# Patient Record
Sex: Male | Born: 1975
Health system: Southern US, Community
[De-identification: ages and names within clinical notes are randomized; demographics above are authoritative.]

## PROBLEM LIST (undated history)

## (undated) DIAGNOSIS — S32009A Unspecified fracture of unspecified lumbar vertebra, initial encounter for closed fracture: Secondary | ICD-10-CM

## (undated) DIAGNOSIS — I10 Essential (primary) hypertension: Secondary | ICD-10-CM

## (undated) DIAGNOSIS — M1990 Unspecified osteoarthritis, unspecified site: Secondary | ICD-10-CM

## (undated) DIAGNOSIS — K219 Gastro-esophageal reflux disease without esophagitis: Secondary | ICD-10-CM

## (undated) DIAGNOSIS — G4733 Obstructive sleep apnea (adult) (pediatric): Secondary | ICD-10-CM

## (undated) DIAGNOSIS — T7840XA Allergy, unspecified, initial encounter: Secondary | ICD-10-CM

## (undated) HISTORY — DX: Allergy, unspecified, initial encounter: T78.40XA

## (undated) HISTORY — DX: Unspecified osteoarthritis, unspecified site: M19.90

## (undated) HISTORY — DX: Obstructive sleep apnea (adult) (pediatric): G47.33

## (undated) HISTORY — DX: Gastro-esophageal reflux disease without esophagitis: K21.9

## (undated) HISTORY — DX: Essential (primary) hypertension: I10

---

## 2009-12-22 ENCOUNTER — Emergency Department: Payer: Self-pay | Admitting: Emergency Medicine

## 2009-12-26 ENCOUNTER — Ambulatory Visit: Payer: Self-pay | Admitting: Family Medicine

## 2016-09-23 ENCOUNTER — Ambulatory Visit (INDEPENDENT_AMBULATORY_CARE_PROVIDER_SITE_OTHER): Payer: 59 | Admitting: Physician Assistant

## 2016-09-23 ENCOUNTER — Encounter: Payer: Self-pay | Admitting: Physician Assistant

## 2016-09-23 VITALS — BP 160/120 | HR 98 | Temp 98.2°F | Resp 18 | Ht 69.5 in | Wt 280.0 lb

## 2016-09-23 DIAGNOSIS — G4733 Obstructive sleep apnea (adult) (pediatric): Secondary | ICD-10-CM

## 2016-09-23 DIAGNOSIS — I10 Essential (primary) hypertension: Secondary | ICD-10-CM | POA: Diagnosis not present

## 2016-09-23 DIAGNOSIS — Z0289 Encounter for other administrative examinations: Secondary | ICD-10-CM

## 2016-09-23 DIAGNOSIS — Z7689 Persons encountering health services in other specified circumstances: Secondary | ICD-10-CM | POA: Diagnosis not present

## 2016-09-23 MED ORDER — HYDROCHLOROTHIAZIDE 25 MG PO TABS: 25.0000 mg | ORAL_TABLET | Freq: Every day | ORAL | 3 refills | Status: DC

## 2016-09-23 NOTE — Progress Notes (Signed)
Patient ID: Adam Crosby MRN: [id number], DOB: January 22, 1976, 40 y.o. Date of Encounter: '@DATE'$ @  Chief Complaint:  Chief Complaint  Patient presents with  . New Patient (Initial Visit)    PHQ SCORE 1    HPI: 40 y.o. year old AA male  presents as above.   He is here as a new patient to establish care. His wife accompanies him for his visit today.  He reports that he was on blood pressure medication in the past.  However that specific blood pressure medication seemed to actually make his blood pressure get even higher and seem to cause nosebleeds (patient is convinced of this).  However says that when he followed up with his doctor, instead they "increased the medicine and made the symptoms even worse."  Patient states that the medication was amlodipine 10 mg. Because of these "adverse effects ", he stopped the blood pressure medication.  Also he states that he needs me to fill out this FMLA form for him He brought this in with him today). He says that in the past he was at work and complained of chest pain and his blood pressure was high and they told him that he could not continue to work-- that he had to leave and go to the doctor. Says that he used up his vacation time for those sick visits. Says that he needs FMLA paperwork completed so he can use FMLA for things like today's visit and any follow-up office visits.  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  Wife also reports that patient stops breathing in his sleep.  They have no other complaints or concerns that they wanted to address today.    Past Medical History:  Diagnosis Date  . Allergy   . Arthritis   . Hypertension      Home Meds: No outpatient prescriptions prior to visit.   No facility-administered medications prior to visit.     Allergies: No Known Allergies  Social History    Social History  . Marital status: Single    Spouse name: N/A  . Number of children: N/A  . Years of education: N/A   Occupational History  . Not on file.   Social History Main Topics  . Smoking status: Former Research scientist (life sciences)  . Smokeless tobacco: Never Used  . Alcohol use 0.6 oz/week    1 Cans of beer per week     Comment: EVERYOTHER MONTH  . Drug use: No  . Sexual activity: Yes   Other Topics Concern  . Not on file   Social History Narrative  . No narrative on file    Family History  Problem Relation Age of Onset  . Arthritis Mother   . Hyperlipidemia Mother   . Hypertension Mother   . Varicose Veins Mother   . Diabetes Daughter   . Arthritis Maternal Grandmother   . Cancer Maternal Grandmother   . Diabetes Maternal Grandmother   . Varicose Veins Maternal Grandmother   . Arthritis Maternal Grandfather   . Cancer Maternal Grandfather   . Hearing loss Maternal Grandfather      Review of Systems:  See HPI for pertinent ROS. All other ROS negative.    Physical Exam: Blood pressure (!) 160/120, pulse 98, temperature 98.2 F (36.8 C), temperature source Oral, resp. rate 18, height 5' 9.5" (1.765 m), weight 280 lb (  127 kg), SpO2 96 %., Body mass index is 40.76 kg/m. General: Obese AAM. Appears in no acute distress. Neck: Supple. No thyromegaly. No lymphadenopathy. He has large neck.  Lungs: Clear bilaterally to auscultation without wheezes, rales, or rhonchi. Breathing is unlabored. Heart: RRR with S1 S2. No murmurs, rubs, or gallops. Abdomen: Soft, non-tender, non-distended with normoactive bowel sounds. No hepatomegaly. No rebound/guarding. No obvious abdominal masses. Musculoskeletal:  Strength and tone normal for age. Extremities/Skin: Warm and dry. No LE edema.  Neuro: Alert and oriented X 3. Moves all extremities spontaneously. Gait is normal. CNII-XII grossly in tact. Psych:  Responds to questions appropriately with a normal affect.     ASSESSMENT AND PLAN:   40 y.o. year old male with  1. Encounter to establish care  2. Essential hypertension Blood pressure is elevated Do not use amlodipine as patient is convinced that this caused adverse effects for him At this time will use HCTZ 25 mg daily. He is to have follow-up office visit in 2 weeks to recheck blood pressure and be met on medication. - hydrochlorothiazide (HYDRODIURIL) 25 MG tablet; Take 1 tablet (25 mg total) by mouth daily.  Dispense: 30 tablet; Refill:0  3. OSA (obstructive sleep apnea) He does have hypertension obesity large neck size and wife reports that she hears times of apnea we'll obtain sleep study. - Split night study; Future  4. Encounter for completion of form with patient I have completed FMLA form. I have documented that he has a follow-up appointment scheduled for 10/09/16. Have documented that after that visit he will need 1 more visit approximate 2 weeks later. I have documented then then after that appointment he will need to be seen every 6 months for office visit and lab work. Documented that at those times he will need to be out of work for 2 hours.  I discussed with patient and wife today that I would recommend him coming in for complete physical exam to check cholesterol etc. And update preventive care-- once we get these other issues addressed. They are interested in doing this and agreeable. At his next visit, once BP is closer to being at goal, I will then have him schedule a complete physical exam and come fasting for that appointment.  [address] [place], [place], [facility] 09/23/2016 2:55 PM

## 2016-09-24 ENCOUNTER — Other Ambulatory Visit: Payer: Self-pay | Admitting: Family Medicine

## 2016-09-24 DIAGNOSIS — G4730 Sleep apnea, unspecified: Secondary | ICD-10-CM

## 2016-10-09 ENCOUNTER — Encounter: Payer: Self-pay | Admitting: Physician Assistant

## 2016-10-09 ENCOUNTER — Ambulatory Visit (INDEPENDENT_AMBULATORY_CARE_PROVIDER_SITE_OTHER): Payer: 59 | Admitting: Physician Assistant

## 2016-10-09 VITALS — BP 158/102 | HR 106 | Temp 99.1°F | Resp 16 | Wt 276.0 lb

## 2016-10-09 DIAGNOSIS — K219 Gastro-esophageal reflux disease without esophagitis: Secondary | ICD-10-CM | POA: Insufficient documentation

## 2016-10-09 DIAGNOSIS — I10 Essential (primary) hypertension: Secondary | ICD-10-CM

## 2016-10-09 HISTORY — DX: Gastro-esophageal reflux disease without esophagitis: K21.9

## 2016-10-09 LAB — BASIC METABOLIC PANEL WITH GFR
BUN: 13 mg/dL (ref 7–25)
CO2: 33 mmol/L — ABNORMAL HIGH (ref 20–31)
Calcium: 9.3 mg/dL (ref 8.6–10.3)
Chloride: 94 mmol/L — ABNORMAL LOW (ref 98–110)
Creat: 1.18 mg/dL (ref 0.60–1.35)
GFR, Est African American: >89 mL/min (ref >=60)
GFR, Est Non African American: 78 mL/min (ref >=60)
Glucose, Bld: 97 mg/dL (ref 70–99)
Potassium: 3.1 mmol/L — ABNORMAL LOW (ref 3.5–5.3)
Sodium: 141 mmol/L (ref 135–146)

## 2016-10-09 MED ORDER — LOSARTAN POTASSIUM 50 MG PO TABS: 50.0000 mg | ORAL_TABLET | Freq: Every day | ORAL | 0 refills | Status: DC

## 2016-10-09 MED ORDER — OMEPRAZOLE 20 MG PO CPDR: 20.0000 mg | DELAYED_RELEASE_CAPSULE | Freq: Every day | ORAL | 3 refills | Status: DC

## 2016-10-09 NOTE — Progress Notes (Signed)
Patient ID: Adam Crosby MRN: [id number], DOB: 01/18/1976, 40 y.o. Date of Encounter: '@DATE'$ @  Chief Complaint:  Chief Complaint  Patient presents with  . Chest Pain  . Diarrhea    HPI: 40 y.o. year old AA male  presents as above.   09/23/2016: He is here as a new patient to establish care. His wife accompanies him for his visit today.  He reports that he was on blood pressure medication in the past.  However that specific blood pressure medication seemed to actually make his blood pressure get even higher and seem to cause nosebleeds (patient is convinced of this).  However says that when he followed up with his doctor, instead they "increased the medicine and made the symptoms even worse."  Patient states that the medication was amlodipine 10 mg. Because of these "adverse effects ", he stopped the blood pressure medication.  Also he states that he needs me to fill out this FMLA form for him He brought this in with him today). He says that in the past he was at work and complained of chest pain and his blood pressure was high and they told him that he could not continue to work-- that he had to leave and go to the doctor. Says that he used up his vacation time for those sick visits. Says that he needs FMLA paperwork completed so he can use FMLA for things like today's visit and any follow-up office visits.  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  Wife also reports that patient stops breathing in his sleep.  They have no other complaints or concerns that they wanted to address today.  AT THAT OV: Rxed HCTZ '25mg'$  QD Ordered Sleep Study  10/09/2016; States that he is taking the HCTZ 25 mg daily. As he actually has been feeling much better since he started this. "Not feeling sluggish "says that some of the other men at his work also have high blood  pressure so they have started walking together after work. He also was drinking sodas at work but has changed to water. That he has lost several pounds since his last visit here. Dates that they have called his wife about the sleep study and he is going to have to call them back to get this scheduled. Says that he has had diarrhea all week. Continued to go to work until yesterday -- yesterday his wife told him he just needed to stay home. Says that he needs a note to turn in to his supervisor. States that he frequently has discomfort in his chest. Says that he it occurs more so at night when he is in bed. He has no chest pressure heaviness or tightness with exertion. He has been walking with coworkers for exercise and is not having angina symptoms with this.  Past Medical History:  Diagnosis Date  . Allergy   . Arthritis   . GERD (gastroesophageal reflux disease) 10/09/2016  . Hypertension      Home Meds: Outpatient Medications Prior to Visit  Medication Sig Dispense Refill  . hydrochlorothiazide (HYDRODIURIL) 25 MG tablet Take 1 tablet (25 mg total) by mouth daily. 90 tablet 3   No facility-administered medications prior to visit.     Allergies: No Known Allergies  Social History   Social History  . Marital status: Single    Spouse name:  N/A  . Number of children: N/A  . Years of education: N/A   Occupational History  . Not on file.   Social History Main Topics  . Smoking status: Former Research scientist (life sciences)  . Smokeless tobacco: Never Used  . Alcohol use 0.6 oz/week    1 Cans of beer per week     Comment: EVERYOTHER MONTH  . Drug use: No  . Sexual activity: Yes   Other Topics Concern  . Not on file   Social History Narrative  . No narrative on file    Family History  Problem Relation Age of Onset  . Arthritis Mother   . Hyperlipidemia Mother   . Hypertension Mother   . Varicose Veins Mother   . Diabetes Daughter   . Arthritis Maternal Grandmother   . Cancer Maternal  Grandmother   . Diabetes Maternal Grandmother   . Varicose Veins Maternal Grandmother   . Arthritis Maternal Grandfather   . Cancer Maternal Grandfather   . Hearing loss Maternal Grandfather      Review of Systems:  See HPI for pertinent ROS. All other ROS negative.    Physical Exam: Blood pressure (!) 158/102, pulse (!) 106, temperature 99.1 F (37.3 C), temperature source Oral, resp. rate 16, weight 276 lb (125.2 kg), SpO2 97 %., Body mass index is 40.17 kg/m. General: Obese AAM. Appears in no acute distress. Neck: Supple. No thyromegaly. No lymphadenopathy. He has large neck.  Lungs: Clear bilaterally to auscultation without wheezes, rales, or rhonchi. Breathing is unlabored. Heart: RRR with S1 S2. No murmurs, rubs, or gallops. Abdomen: Soft, non-tender, non-distended with normoactive bowel sounds. No hepatomegaly. No rebound/guarding. No obvious abdominal masses. Musculoskeletal:  Strength and tone normal for age. No tenderness with palpation of the chest wall and along the peri-sternal region. Extremities/Skin: Warm and dry. No LE edema.  Neuro: Alert and oriented X 3. Moves all extremities spontaneously. Gait is normal. CNII-XII grossly in tact. Psych:  Responds to questions appropriately with a normal affect.     ASSESSMENT AND PLAN:  40 y.o. year old male with    Essential hypertension Blood pressure is improved but still suboptimal.   Continue HCTZ 25 mg daily. Add losartan 50 mg daily. Follow-up in 2 weeks for OV and follow-up lab.  Gastroesophageal reflux disease, esophagitis presence not specified Suspect that his chest discomfort is coming from acid reflux. Symptoms are worse when lying in bed at night. There is no tenderness with palpation of the chest wall or the peri- sternal region. Symptoms are not exertional and not consistent with angina. - omeprazole (PRILOSEC) 20 MG capsule; Take 1 capsule (20 mg total) by mouth daily.  Dispense: 30 capsule; Refill:  3  10/09/2016 OV--Note given to cover out of work yesterday with diarrhea.   OSA (obstructive sleep apnea) He does have hypertension obesity large neck size and wife reports that she hears times of apnea we'll obtain sleep study. Sleep Study was ordered at his visit with me 09/23/16. He is in the process of getting this scheduled.  At initial [place] 09/2016--Did this---Encounter for completion of form with patient I have completed FMLA form. I have documented that he has a follow-up appointment scheduled for 10/09/16. Have documented that after that visit he will need 1 more visit approximate 2 weeks later. I have documented then then after that appointment he will need to be seen every 6 months for office visit and lab work. Documented that at those times he will need to be out  of work for 2 hours.   He will return for follow-up in 2 weeks. Will schedule that visit as a complete physical exam for early mornings he can come fasting to that appointment to check fasting labs. Also follow-up his blood pressure and be met and also see whether adding omeprazole has helped with his chest discomfort.  Marin Olp [place], [place], [facility] 10/09/2016 8:42 AM

## 2016-10-22 ENCOUNTER — Encounter: Payer: Self-pay | Admitting: Physician Assistant

## 2016-10-22 ENCOUNTER — Ambulatory Visit (INDEPENDENT_AMBULATORY_CARE_PROVIDER_SITE_OTHER): Payer: 59 | Admitting: Physician Assistant

## 2016-10-22 VITALS — BP 120/82 | HR 99 | Temp 98.3°F | Resp 18 | Wt 276.0 lb

## 2016-10-22 DIAGNOSIS — K219 Gastro-esophageal reflux disease without esophagitis: Secondary | ICD-10-CM | POA: Diagnosis not present

## 2016-10-22 DIAGNOSIS — I10 Essential (primary) hypertension: Secondary | ICD-10-CM | POA: Diagnosis not present

## 2016-10-22 DIAGNOSIS — G4733 Obstructive sleep apnea (adult) (pediatric): Secondary | ICD-10-CM

## 2016-10-22 DIAGNOSIS — Z0000 Encounter for general adult medical examination without abnormal findings: Secondary | ICD-10-CM

## 2016-10-22 HISTORY — DX: Obstructive sleep apnea (adult) (pediatric): G47.33

## 2016-10-22 NOTE — Progress Notes (Signed)
Patient ID: Adam Crosby MRN: [id number], DOB: Apr 25, 1976 40 y.o. Date of Encounter: 10/22/2016, 3:14 PM    Chief Complaint: Physical (CPE)  HPI: 40 y.o. y/o male here for CPE.   He has had 2 office visits with me prior to today. At his visit with me 09/23/16 he had significant hypertension. At that visit HCTZ 25 mg daily was added. He had follow-up office visit with me 10/09/16. Blood pressure was improved but still not at goal. Losartan 50 mg daily was added at that time. Today he states that he is still taking both of these medicines as directed. No adverse effects.  Also reviewed that it last office visit 10/09/16 added omeprazole for GERD symptoms. Today he states that this is working well and that he feels a lot better.  At initial visit 09/23/16 I had also placed order for sleep study. He states that they were still in the process of calling each other back and forth to actually get this scheduled.  Obesity: At visit 10/09/16 he stated that he and some friends from work had started walking together after work. He had stopped sodas and was drinking water.  Today he says that he has slacked off on this some. We also reviewed his weights. 09/23/16--- 280. 10/09/16--- 276.   Today--- 276  He has no specific complaints or concerns to address today.   Review of Systems: Consitutional: No fever, chills, fatigue, night sweats, lymphadenopathy, or weight changes. Eyes: No visual changes, eye redness, or discharge. ENT/Mouth: Ears: No otalgia, tinnitus, hearing loss, discharge. Nose: No congestion, rhinorrhea, sinus pain, or epistaxis. Throat: No sore throat, post nasal drip, or teeth pain. Cardiovascular: No CP, palpitations, diaphoresis, DOE, edema, orthopnea, PND. Respiratory: No cough, hemoptysis, SOB, or wheezing. Gastrointestinal: No anorexia, dysphagia, reflux, pain, nausea, vomiting, hematemesis, diarrhea, constipation, BRBPR, or melena. Genitourinary: No dysuria, frequency,  urgency, hematuria, incontinence, nocturia, decreased urinary stream, discharge, impotence, or testicular pain/masses. Musculoskeletal: No decreased ROM, myalgias, stiffness, joint swelling, or weakness. Skin: No rash, erythema, lesion changes, pain, warmth, jaundice, or pruritis. Neurological: No headache, dizziness, syncope, seizures, tremors, memory loss, coordination problems, or paresthesias. Psychological: No anxiety, depression, hallucinations, SI/HI. Endocrine: No fatigue, polydipsia, polyphagia, polyuria, or known diabetes. All other systems were reviewed and are otherwise negative.  Past Medical History:  Diagnosis Date  . Allergy   . Arthritis   . GERD (gastroesophageal reflux disease) 10/09/2016  . Hypertension      No past surgical history on file.  Home Meds:  Outpatient Medications Prior to Visit  Medication Sig Dispense Refill  . hydrochlorothiazide (HYDRODIURIL) 25 MG tablet Take 1 tablet (25 mg total) by mouth daily. 90 tablet 3  . losartan (COZAAR) 50 MG tablet Take 1 tablet (50 mg total) by mouth daily. 30 tablet 0  . omeprazole (PRILOSEC) 20 MG capsule Take 1 capsule (20 mg total) by mouth daily. 30 capsule 3   No facility-administered medications prior to visit.     Allergies: No Known Allergies  Social History   Social History  . Marital status: Single    Spouse name: N/A  . Number of children: N/A  . Years of education: N/A   Occupational History  . Not on file.   Social History Main Topics  . Smoking status: Former Games developer  . Smokeless tobacco: Never Used  . Alcohol use 0.6 oz/week    1 Cans of beer per week     Comment: EVERYOTHER MONTH  . Drug use: No  .  Sexual activity: Yes   Other Topics Concern  . Not on file   Social History Narrative  . No narrative on file    Family History  Problem Relation Age of Onset  . Arthritis Mother   . Hyperlipidemia Mother   . Hypertension Mother   . Varicose Veins Mother   . Diabetes Daughter     . Arthritis Maternal Grandmother   . Cancer Maternal Grandmother   . Diabetes Maternal Grandmother   . Varicose Veins Maternal Grandmother   . Arthritis Maternal Grandfather   . Cancer Maternal Grandfather   . Hearing loss Maternal Grandfather     Physical Exam: Blood pressure 120/82, pulse 99, temperature 98.3 F (36.8 C), temperature source Oral, resp. rate 18, weight 276 lb (125.2 kg), SpO2 96 %.  General: Obese AAM. Appears in no acute distress. HEENT: Normocephalic, atraumatic. Conjunctiva pink, sclera non-icteric. Pupils 2 mm constricting to 1 mm, round, regular, and equally reactive to light and accomodation. EOMI. Internal auditory canal clear. TMs with good cone of light and without pathology. Nasal mucosa pink. Nares are without discharge. No sinus tenderness. Oral mucosa pink. Pharynx without exudate.   Neck: Supple. Trachea midline. No thyromegaly. Full ROM. No lymphadenopathy. Lungs: Clear to auscultation bilaterally without wheezes, rales, or rhonchi. Breathing is of normal effort and unlabored. Cardiovascular: RRR with S1 S2. No murmurs, rubs, or gallops. Distal pulses 2+ symmetrically. No carotid or abdominal bruits. Abdomen: Soft, non-tender, non-distended with normoactive bowel sounds. No hepatosplenomegaly or masses. No rebound/guarding. No CVA tenderness. No hernias. Musculoskeletal: Full range of motion and 5/5 strength throughout.  Skin: Warm and moist without erythema, ecchymosis, wounds, or rash. Neuro: A+Ox3. CN II-XII grossly intact. Moves all extremities spontaneously. Full sensation throughout. Normal gait. DTR 2+ throughout upper and lower extremities.  Psych:  Responds to questions appropriately with a normal affect.   Assessment/Plan:  40 y.o. y/o  male here for CPE  -1. Encounter for preventive health examination  A. Screening Labs: He is not fasting today but states that he can return fasting tomorrow morning. Will check screening labs at that time. -  CBC with Differential/Platelet; Future - COMPLETE METABOLIC PANEL WITH GFR; Future - Lipid panel; Future - TSH; Future - VITAMIN D 25 Hydroxy (Vit-D Deficiency, Fractures); Future   B. Screening For Prostate Cancer: No indication to start this until age 40  C. Screening For Colorectal Cancer:  No indication to start this until age 40. The only family history of colon cancer he has is with his maternal grandfather but he did not develop colon cancer until age 40.  D. Immunizations: Flu----------- recommended flu vaccine but he defers. Tetanus-----he has a card in his wallet documenting tetanus vaccine and this was given 12/22/2009. Pneumococcal--- has no indication to require a pneumonia vaccine until age 40 Zostavax------------ not indicated to give until age 40    2. Essential hypertension Blood Pressure is now at goal. He is going to return fasting for labs tomorrow and will recheck bmet at that time.  3. Gastroesophageal reflux disease, esophagitis presence not specified Symptoms are now controlled with omeprazole.  4. Severe obesity (BMI >= 40) (HCC) At visit 10/09/16 he stated that he and some friends from work had started walking together after work. He had stopped sodas and was drinking water.  Today he says that he has slacked off on this some. We also reviewed his weights. 09/23/16--- 280. 10/09/16--- 276.   Today--- 276 Encouraged him to be careful of his diet  intake during the holidays and then after the holidays needs to get back with the walking and the stricter diet changes.  5. OSA (obstructive sleep apnea) At visit 09/23/16 sleep study was ordered. He is still in the process of getting this scheduled that does plan to follow-up with doing this.  If things remain stable can wait 6 months for routine follow-up visit. Follow-up sooner if needed.  Signed:   [address] Beth [place], [place]  10/22/2016 3:14 PM

## 2016-10-23 ENCOUNTER — Other Ambulatory Visit: Payer: 59

## 2016-10-23 DIAGNOSIS — Z0000 Encounter for general adult medical examination without abnormal findings: Secondary | ICD-10-CM

## 2016-10-23 LAB — COMPLETE METABOLIC PANEL WITH GFR
ALT: 38 U/L (ref 9–46)
AST: 24 U/L (ref 10–40)
Albumin: 3.7 g/dL (ref 3.6–5.1)
Alkaline Phosphatase: 57 U/L (ref 40–115)
BUN: 13 mg/dL (ref 7–25)
CO2: 34 mmol/L — ABNORMAL HIGH (ref 20–31)
Calcium: 8.7 mg/dL (ref 8.6–10.3)
Chloride: 99 mmol/L (ref 98–110)
Creat: 1.01 mg/dL (ref 0.60–1.35)
GFR, Est African American: >89 mL/min (ref >=60)
GFR, Est Non African American: >89 mL/min (ref >=60)
Glucose, Bld: 101 mg/dL — ABNORMAL HIGH (ref 70–99)
Potassium: 3.5 mmol/L (ref 3.5–5.3)
Sodium: 143 mmol/L (ref 135–146)
Total Bilirubin: 0.5 mg/dL (ref 0.2–1.2)
Total Protein: 7 g/dL (ref 6.1–8.1)

## 2016-10-23 LAB — CBC WITH DIFFERENTIAL/PLATELET
Basophils Absolute: 0 cells/uL (ref 0–200)
Basophils Relative: 0 %
Eosinophils Absolute: 177 cells/uL (ref 15–500)
Eosinophils Relative: 3 %
HCT: 46.9 % (ref 38.5–50.0)
Hemoglobin: 15.1 g/dL (ref 13.0–17.0)
Lymphocytes Relative: 32 %
Lymphs Abs: 1888 cells/uL (ref 850–3900)
MCH: 27 pg (ref 27.0–33.0)
MCHC: 32.2 g/dL (ref 32.0–36.0)
MCV: 83.8 fL (ref 80.0–100.0)
MPV: 9.6 fL (ref 7.5–12.5)
Monocytes Absolute: 295 cells/uL (ref 200–950)
Monocytes Relative: 5 %
Neutro Abs: 3540 cells/uL (ref 1500–7800)
Neutrophils Relative %: 60 %
Platelets: 307 10*3/uL (ref 140–400)
RBC: 5.6 MIL/uL (ref 4.20–5.80)
RDW: 14.7 % (ref 11.0–15.0)
WBC: 5.9 10*3/uL (ref 3.8–10.8)

## 2016-10-23 LAB — LIPID PANEL
Cholesterol: 165 mg/dL (ref <200)
HDL: 37 mg/dL — ABNORMAL LOW (ref >40)
LDL Cholesterol: 103 mg/dL — ABNORMAL HIGH (ref <100)
Total CHOL/HDL Ratio: 4.5 Ratio (ref <5.0)
Triglycerides: 123 mg/dL (ref <150)
VLDL: 25 mg/dL (ref <30)

## 2016-10-23 LAB — TSH: TSH: 1.21 mIU/L (ref 0.40–4.50)

## 2016-10-24 LAB — VITAMIN D 25 HYDROXY (VIT D DEFICIENCY, FRACTURES): Vit D, 25-Hydroxy: 11 ng/mL — ABNORMAL LOW (ref 30–100)

## 2016-10-26 ENCOUNTER — Telehealth: Payer: Self-pay

## 2016-10-26 DIAGNOSIS — E559 Vitamin D deficiency, unspecified: Secondary | ICD-10-CM | POA: Insufficient documentation

## 2016-10-26 MED ORDER — CHOLECALCIFEROL 100 MCG (4000 UT) PO CAPS: 1.0000 | ORAL_CAPSULE | Freq: Every day | ORAL | Status: DC

## 2016-10-26 NOTE — Telephone Encounter (Signed)
Vit D added to med list

## 2016-10-27 ENCOUNTER — Other Ambulatory Visit: Payer: Self-pay | Admitting: Physician Assistant

## 2016-10-27 DIAGNOSIS — I10 Essential (primary) hypertension: Secondary | ICD-10-CM

## 2016-12-12 ENCOUNTER — Other Ambulatory Visit: Payer: Self-pay | Admitting: Physician Assistant

## 2016-12-12 DIAGNOSIS — I10 Essential (primary) hypertension: Secondary | ICD-10-CM

## 2016-12-14 NOTE — Telephone Encounter (Signed)
Refill appropriate 

## 2016-12-16 ENCOUNTER — Ambulatory Visit: Payer: 59 | Admitting: Physician Assistant

## 2016-12-21 ENCOUNTER — Encounter: Payer: Self-pay | Admitting: Physician Assistant

## 2016-12-21 ENCOUNTER — Ambulatory Visit (INDEPENDENT_AMBULATORY_CARE_PROVIDER_SITE_OTHER): Payer: 59 | Admitting: Physician Assistant

## 2016-12-21 VITALS — BP 122/88 | HR 117 | Temp 99.6°F | Resp 18 | Wt 280.0 lb

## 2016-12-21 DIAGNOSIS — A084 Viral intestinal infection, unspecified: Secondary | ICD-10-CM

## 2016-12-21 MED ORDER — PROMETHAZINE HCL 25 MG PO TABS: 25.0000 mg | ORAL_TABLET | Freq: Three times a day (TID) | ORAL | 0 refills | Status: DC | PRN

## 2016-12-21 NOTE — Progress Notes (Signed)
Patient ID: Adam KirksShuan Crosby MRN: [id number], DOB: [birth year], 41 y.o. Date of Encounter: 12/21/2016, 10:00 AM    Chief Complaint:  Chief Complaint  Patient presents with  . Diarrhea    x5days  . Fever  . Emesis     HPI: 41 y.o. year old male presents with above.   Says that this started Wednesday, February 7. That day he had some low-grade fever and diarrhea. Thuirsday and Friday he did go to work. He had no diarrhea but says he really wasn't eating as much as usual. Did feel bad those days but he "just pushed through". Then on Saturday "pushed myself ". Says that Sunday he ended up having to lay down and then about 5 PM started with vomiting and diarrhea. Vomited 3 or 4 times and has had some diarrhea. Did eat toast and eggs Sunday morning. Says that when he has eaten he has been eating usual foods and his usual diet.  Says his wife Haitigrandbaby and daughter are all sick with GI symptoms.     Home Meds:   Outpatient Medications Prior to Visit  Medication Sig Dispense Refill  . Cholecalciferol 4000 units CAPS Take 1 capsule (4,000 Units total) by mouth daily. 30 capsule   . hydrochlorothiazide (HYDRODIURIL) 25 MG tablet Take 1 tablet (25 mg total) by mouth daily. 90 tablet 3  . losartan (COZAAR) 50 MG tablet TAKE ONE TABLET BY MOUTH ONCE DAILY 90 tablet 1  . omeprazole (PRILOSEC) 20 MG capsule Take 1 capsule (20 mg total) by mouth daily. 30 capsule 3   No facility-administered medications prior to visit.     Allergies: No Known Allergies    Review of Systems: See HPI for pertinent ROS. All other ROS negative.    Physical Exam: Blood pressure 122/88, pulse 88, temperature 99.6 F (37.6 C), temperature source Oral, resp. rate 18, weight 280 lb (127 kg), SpO2 97 %., Body mass index is 40.76 kg/m. General: Obese AAM.  Appears in no acute distress. Neck: Supple. No thyromegaly. No lymphadenopathy. Lungs: Clear bilaterally to auscultation without wheezes, rales, or rhonchi.  Breathing is unlabored. Heart: Regular rhythm. No murmurs, rubs, or gallops. Abdomen: Soft,  non-distended with normoactive bowel sounds. No hepatomegaly. No rebound/guarding. No obvious abdominal masses.No focal/localized area of tenderness with palpation. No area of tenderness with palpation--says "it's been hurting all across there" Msk:  Strength and tone normal for age. Extremities/Skin: Warm and dry.  Neuro: Alert and oriented X 3. Moves all extremities spontaneously. Gait is normal. CNII-XII grossly in tact. Psych:  Responds to questions appropriately with a normal affect.     ASSESSMENT AND PLAN:  41 y.o. year old male with  1. Viral gastroenteritis - promethazine (PHENERGAN) 25 MG tablet; Take 1 tablet (25 mg total) by mouth every 8 (eight) hours as needed for nausea or vomiting.  Dispense: 20 tablet; Refill: 0 Cautioned that Phenergan will cause drowsiness. He can use this every 4-6 hours as needed for nausea or vomiting. Stay with clear liquid diet and then gradually advance to a bland diet. Discussed that even if his nausea decreases enough that he feels like he can eat, that he needs to stay with this diet to give his GI system a rest from having to digest food. Note given to cover him missing work last Wednesday and also for him to be out today and tomorrow. F/U if symptoms worsen or are not resolving in the next 48 hours.  Signed, Shon HaleMary Beth [place], [place], [place]  12/21/2016 10:00 AM

## 2016-12-23 ENCOUNTER — Telehealth: Payer: Self-pay | Admitting: Physician Assistant

## 2016-12-23 NOTE — Telephone Encounter (Signed)
Pt is still having severe diarrhea and wants to know if the doctor note can be extended. Please call back. If so please fax doctor note to [phone].

## 2016-12-23 NOTE — Telephone Encounter (Signed)
Tried calling pt no answer note faxed to [phone]

## 2016-12-23 NOTE — Telephone Encounter (Signed)
Pls see note below and advise  

## 2016-12-23 NOTE — Telephone Encounter (Signed)
Had OV 12/21/16. At that time I gave note including out of work 2/12 and 2/13. At this time can give another note for him to be out of work 2/14 , 2/15, and 2/16 with plans to return Monday 12/28/16. Tell him to force fluids----even if he doesn't feel real thirsty make himself drink ginger ale etc. In order to prevent getting dehydrated from fluid loss from diarrhea. If not resolving by midday Friday, call here Friday.

## 2017-01-07 ENCOUNTER — Encounter: Payer: Self-pay | Admitting: Physician Assistant

## 2017-02-25 ENCOUNTER — Other Ambulatory Visit: Payer: Self-pay | Admitting: Physician Assistant

## 2017-02-25 DIAGNOSIS — K219 Gastro-esophageal reflux disease without esophagitis: Secondary | ICD-10-CM

## 2017-03-09 ENCOUNTER — Encounter: Payer: Self-pay | Admitting: Physician Assistant

## 2017-03-23 ENCOUNTER — Encounter: Payer: Self-pay | Admitting: Family Medicine

## 2017-03-23 ENCOUNTER — Ambulatory Visit (INDEPENDENT_AMBULATORY_CARE_PROVIDER_SITE_OTHER): Payer: 59 | Admitting: Family Medicine

## 2017-03-23 ENCOUNTER — Ambulatory Visit (HOSPITAL_COMMUNITY)
Admission: RE | Admit: 2017-03-23 | Discharge: 2017-03-23 | Disposition: A | Discharge status: Home / Self Care | Payer: 59 | Source: Ambulatory Visit | Attending: Family Medicine | Admitting: Family Medicine

## 2017-03-23 VITALS — BP 146/110 | HR 100 | Temp 99.0°F | Resp 22 | Ht 68.5 in | Wt 276.0 lb

## 2017-03-23 DIAGNOSIS — G4733 Obstructive sleep apnea (adult) (pediatric): Secondary | ICD-10-CM

## 2017-03-23 DIAGNOSIS — R079 Chest pain, unspecified: Secondary | ICD-10-CM | POA: Diagnosis present

## 2017-03-23 DIAGNOSIS — I10 Essential (primary) hypertension: Secondary | ICD-10-CM

## 2017-03-23 MED ORDER — METOPROLOL SUCCINATE ER 25 MG PO TB24: 25.0000 mg | ORAL_TABLET | Freq: Every day | ORAL | 3 refills | Status: DC

## 2017-03-23 NOTE — Progress Notes (Signed)
Subjective:    Patient ID: Adam Crosby, male    DOB: 31-Jan-1976, 41 y.o.   MRN: [id number]  HPI Patient is a 41 year old African-American male with a history of morbid obesity, obstructive sleep apnea, hypertension, and mild dyslipidemia who presents today with left-sided chest pain.  He states that the pain has been occurring off and on for the last 2-3 months. The pain occurs in his chest with movement. If he twists, bends over to pick something up, works rapidly at work, he will feel a pressure stabbing soreness in his chest. However he denies any dyspnea on exertion and he denies any chest pain with exertion. As long as he is not using his arms or his upper torso, he can walk 2-3 miles with no chest discomfort and does every evening. He denies any angina. He denies any shortness of breath. He is not wearing CPAP because it has not been ordered yet. He is also under tremendous amount of stress. His daughter was recently arrested for stabbing her partner. His grandson is causing problems in school that he is having to raise for his daughter. His grandmother who is basically his parent is dying from breast cancer. All this is occurring simultaneously in causing tremendous stress.  Past Medical History:  Diagnosis Date  . Allergy   . Arthritis   . GERD (gastroesophageal reflux disease) 10/09/2016  . Hypertension   . OSA (obstructive sleep apnea) 10/22/2016   No past surgical history on file. Current Outpatient Prescriptions on File Prior to Visit  Medication Sig Dispense Refill  . Cholecalciferol 4000 units CAPS Take 1 capsule (4,000 Units total) by mouth daily. 30 capsule   . hydrochlorothiazide (HYDRODIURIL) 25 MG tablet Take 1 tablet (25 mg total) by mouth daily. 90 tablet 3  . losartan (COZAAR) 50 MG tablet TAKE ONE TABLET BY MOUTH ONCE DAILY 90 tablet 1  . omeprazole (PRILOSEC) 20 MG capsule TAKE ONE CAPSULE BY MOUTH ONCE DAILY 30 capsule 3   No current facility-administered  medications on file prior to visit.    No Known Allergies Social History   Social History  . Marital status: Single    Spouse name: N/A  . Number of children: N/A  . Years of education: N/A   Occupational History  . Not on file.   Social History Main Topics  . Smoking status: Former Games developermoker  . Smokeless tobacco: Never Used  . Alcohol use 0.6 oz/week    1 Cans of beer per week     Comment: EVERYOTHER MONTH  . Drug use: No  . Sexual activity: Yes   Other Topics Concern  . Not on file   Social History Narrative  . No narrative on file      Review of Systems  All other systems reviewed and are negative.      Objective:   Physical Exam  Constitutional: He appears well-developed and well-nourished.  Neck: No JVD present.  Cardiovascular: Normal rate, regular rhythm and normal heart sounds.   No murmur heard. Pulmonary/Chest: Effort normal and breath sounds normal. No respiratory distress. He has no wheezes. He has no rales. He exhibits no tenderness.  Abdominal: Soft. Bowel sounds are normal. He exhibits no distension. There is no tenderness. There is no rebound and no guarding.  Musculoskeletal: He exhibits no edema.  Vitals reviewed.  EKG reveals normal sinus rhythm with normal intervals and normal axis. There are nonspecific ST changes in lead 1, and lead aVL. There is no evidence of  infarction.             Assessment & Plan:  Chest pain, unspecified type - Plan: EKG 12-Lead, DG Chest 2 View  Essential hypertension - Plan: metoprolol succinate (TOPROL-XL) 25 MG 24 hr tablet  Severe obesity (BMI >= 40) (HCC)  OSA (obstructive sleep apnea)  Chest pain does not sound cardiac in nature. I believe it sounds more muscular or possibly related to anxiety. I will obtain a chest x-ray to rule out structural lesions in the chest that can cause chest pain. Also believe that with his risk factors, he would benefit from a plain exercise treadmill test as I believe he  is a very low risk patient to have ischemic coronary artery disease. If chest x-ray and stress test are normal, I would attribute the chest pain to muscle pain or possibly anxiety related chest wall pain

## 2017-03-25 ENCOUNTER — Telehealth: Payer: Self-pay | Admitting: Physician Assistant

## 2017-03-25 NOTE — Telephone Encounter (Signed)
Received fmla ppw from this patient and routed to sandy on 03/26/17

## 2017-03-26 NOTE — Telephone Encounter (Signed)
LMTRC  Need to know why we are doing FMLA!?!

## 2017-03-30 NOTE — Telephone Encounter (Signed)
LMTRC

## 2017-04-07 NOTE — Telephone Encounter (Signed)
Spoke to pt's wife and she states that he needs FMLA for the times he is going to be out of work in the future for his chest pain. He had an episode last night and needs it for last night as well as future episodes.

## 2017-04-08 ENCOUNTER — Other Ambulatory Visit: Payer: Self-pay | Admitting: Family Medicine

## 2017-04-08 DIAGNOSIS — R0789 Other chest pain: Secondary | ICD-10-CM

## 2017-04-08 NOTE — Telephone Encounter (Signed)
Denied.  I will give him a note for 1 day yesterday and he needs to see cardiology for treadmill stress test.

## 2017-04-08 NOTE — Telephone Encounter (Signed)
LMTRC

## 2017-04-14 NOTE — Telephone Encounter (Signed)
LMTRC

## 2017-04-23 ENCOUNTER — Encounter: Payer: Self-pay | Admitting: Cardiology

## 2017-04-23 ENCOUNTER — Ambulatory Visit (INDEPENDENT_AMBULATORY_CARE_PROVIDER_SITE_OTHER): Payer: 59 | Admitting: Cardiology

## 2017-04-23 ENCOUNTER — Ambulatory Visit: Payer: 59 | Admitting: Physician Assistant

## 2017-04-23 VITALS — BP 138/88 | HR 90 | Ht 69.0 in | Wt 272.0 lb

## 2017-04-23 DIAGNOSIS — R0789 Other chest pain: Secondary | ICD-10-CM

## 2017-04-23 NOTE — Patient Instructions (Signed)
Your physician recommends that you schedule a follow-up appointment in: 3 months with Dr Wyline MoodBranch    Your physician recommends that you continue on your current medications as directed. Please refer to the Current Medication list given to you today.   No testing or blood work today      Thank you for [facility] !

## 2017-04-23 NOTE — Progress Notes (Signed)
Clinical Summary [name] is a 41 y.o.male seen as new consult. Referred by Dr Tanya Nones for chest pain  1. Chest pain - symptoms started about 6 months - can occur at rest or with activity. Aching pain midchest, 3/10 in severity. +SOB, no other symptoms. Can last several hours. Worst with position. No relation to food. Can be worst with deep breaths - can have some with DOE activities. No LE edema. Better with tylenol. Worst with movement.  - he reports at work does a lot of lifting with upper body, can trigger his symptoms.    CAD: HTN, borderline DM2    Past Medical History:  Diagnosis Date  . Allergy   . Arthritis   . GERD (gastroesophageal reflux disease) 10/09/2016  . Hypertension   . OSA (obstructive sleep apnea) 10/22/2016     No Known Allergies   Current Outpatient Prescriptions  Medication Sig Dispense Refill  . Cholecalciferol 4000 units CAPS Take 1 capsule (4,000 Units total) by mouth daily. 30 capsule   . hydrochlorothiazide (HYDRODIURIL) 25 MG tablet Take 1 tablet (25 mg total) by mouth daily. 90 tablet 3  . losartan (COZAAR) 50 MG tablet TAKE ONE TABLET BY MOUTH ONCE DAILY 90 tablet 1  . metoprolol succinate (TOPROL-XL) 25 MG 24 hr tablet Take 1 tablet (25 mg total) by mouth daily. 90 tablet 3  . omeprazole (PRILOSEC) 20 MG capsule TAKE ONE CAPSULE BY MOUTH ONCE DAILY 30 capsule 3   No current facility-administered medications for this visit.      No past surgical history on file.   No Known Allergies    Family History  Problem Relation Age of Onset  . Arthritis Mother   . Hyperlipidemia Mother   . Hypertension Mother   . Varicose Veins Mother   . Diabetes Daughter   . Arthritis Maternal Grandmother   . Cancer Maternal Grandmother   . Diabetes Maternal Grandmother   . Varicose Veins Maternal Grandmother   . Arthritis Maternal Grandfather   . Cancer Maternal Grandfather   . Hearing loss Maternal Grandfather      Social  History [name] reports that he has quit smoking. He has never used smokeless tobacco. [name] reports that he drinks about 0.6 oz of alcohol per week .   Review of Systems CONSTITUTIONAL: No weight loss, fever, chills, weakness or fatigue.  HEENT: Eyes: No visual loss, blurred vision, double vision or yellow sclerae.No hearing loss, sneezing, congestion, runny nose or sore throat.  SKIN: No rash or itching.  CARDIOVASCULAR: per hpi RESPIRATORY: No shortness of breath, cough or sputum.  GASTROINTESTINAL: No anorexia, nausea, vomiting or diarrhea. No abdominal pain or blood.  GENITOURINARY: No burning on urination, no polyuria NEUROLOGICAL: No headache, dizziness, syncope, paralysis, ataxia, numbness or tingling in the extremities. No change in bowel or bladder control.  MUSCULOSKELETAL: No muscle, back pain, joint pain or stiffness.  LYMPHATICS: No enlarged nodes. No history of splenectomy.  PSYCHIATRIC: No history of depression or anxiety.  ENDOCRINOLOGIC: No reports of sweating, cold or heat intolerance. No polyuria or polydipsia.  Adam Crosby   Physical Examination Vitals:   04/23/17 0931 04/23/17 0936  BP: 129/89 138/88  Pulse: 88 90   Vitals:   04/23/17 0931  Weight: 272 lb (123.4 kg)  Height: 5\' 9"  (1.753 m)    Gen: resting comfortably, no acute distress HEENT: no scleral icterus, pupils equal round and reactive, no palptable cervical adenopathy,  CV: RRR, no m/r/g, no jvd Resp:  Clear to auscultation bilaterally GI: abdomen is soft, non-tender, non-distended, normal bowel sounds, no hepatosplenomegaly MSK: extremities are warm, no edema.  Skin: warm, no rash Neuro:  no focal deficits Psych: appropriate affect      Assessment and Plan  1. Chest pain - non cardiac chest pain. Would not recommend ischemic testing at this time - continue to monitor, if symptoms change over time can reconsider. - manage with prn tylenol and NSAIDs    F/u 3  months   Adam PocheJonathan F. Crosby Adam Crosby, M.D.,

## 2017-04-29 ENCOUNTER — Telehealth: Payer: Self-pay | Admitting: Family Medicine

## 2017-04-29 ENCOUNTER — Encounter: Payer: Self-pay | Admitting: Physician Assistant

## 2017-04-29 NOTE — Telephone Encounter (Signed)
Pt's wife called and states that pt did go see cardiology and would like to know if you will fill out FMLA for him?

## 2017-04-29 NOTE — Telephone Encounter (Signed)
Why is he needing FMLA forms? What does seeing a cardiologist have to do with needing FMLA form? I reviewed cardiology note and see nothing in that note indicating need to be out of work etc.

## 2017-04-30 NOTE — Telephone Encounter (Signed)
Sent to [facility] by accident.

## 2017-04-30 NOTE — Telephone Encounter (Signed)
I will not fill out FMLA.  There is nothing found so far that will explain his chest pain necessitating him to miss work.  He needs to be seen if he keeps having chest pain and missing work.  Cardiology found nothing wrong and CXR has been clear.

## 2017-05-03 NOTE — Telephone Encounter (Signed)
Patient aware of providers recommendations via vm 

## 2017-05-03 NOTE — Telephone Encounter (Signed)
Pt's wife called back and made aware also and wanted to know about him paying on his account as he has been dismissed as of 06/01/17.[facility]

## 2017-05-06 NOTE — Telephone Encounter (Signed)
Pt must pay entire bill before returning as a pt here per Carollee [employer]annon [employer] producer(practice admin). She has left several messages and made several phone calls to pt and pt's wife informing them of this.

## 2017-06-29 ENCOUNTER — Emergency Department (HOSPITAL_COMMUNITY): Payer: 59

## 2017-06-29 ENCOUNTER — Encounter (HOSPITAL_COMMUNITY): Payer: Self-pay | Admitting: Cardiology

## 2017-06-29 ENCOUNTER — Emergency Department (HOSPITAL_COMMUNITY)
Admission: EM | Admit: 2017-06-29 | Discharge: 2017-06-29 | Disposition: A | Discharge status: Home Health | Payer: 59 | Attending: Emergency Medicine | Admitting: Emergency Medicine

## 2017-06-29 DIAGNOSIS — Z79899 Other long term (current) drug therapy: Secondary | ICD-10-CM | POA: Insufficient documentation

## 2017-06-29 DIAGNOSIS — I10 Essential (primary) hypertension: Secondary | ICD-10-CM | POA: Insufficient documentation

## 2017-06-29 DIAGNOSIS — R079 Chest pain, unspecified: Secondary | ICD-10-CM | POA: Insufficient documentation

## 2017-06-29 DIAGNOSIS — R0789 Other chest pain: Secondary | ICD-10-CM

## 2017-06-29 LAB — BASIC METABOLIC PANEL
Anion gap: 9 (ref 5–15)
BUN: 16 mg/dL (ref 6–20)
CO2: 33 mmol/L — ABNORMAL HIGH (ref 22–32)
Calcium: 9.2 mg/dL (ref 8.9–10.3)
Chloride: 98 mmol/L — ABNORMAL LOW (ref 101–111)
Creatinine, Ser: 1.11 mg/dL (ref 0.61–1.24)
GFR calc Af Amer: >60 mL/min (ref >60)
GFR calc non Af Amer: >60 mL/min (ref >60)
Glucose, Bld: 120 mg/dL — ABNORMAL HIGH (ref 65–99)
Potassium: 2.9 mmol/L — ABNORMAL LOW (ref 3.5–5.1)
Sodium: 140 mmol/L (ref 135–145)

## 2017-06-29 LAB — CBC
HCT: 49.3 % (ref 39.0–52.0)
Hemoglobin: 16.4 g/dL (ref 13.0–17.0)
MCH: 28.4 pg (ref 26.0–34.0)
MCHC: 33.3 g/dL (ref 30.0–36.0)
MCV: 85.4 fL (ref 78.0–100.0)
Platelets: 290 10*3/uL (ref 150–400)
RBC: 5.77 MIL/uL (ref 4.22–5.81)
RDW: 13.9 % (ref 11.5–15.5)
WBC: 7.1 10*3/uL (ref 4.0–10.5)

## 2017-06-29 LAB — HEPATIC FUNCTION PANEL
ALT: 62 U/L (ref 17–63)
AST: 36 U/L (ref 15–41)
Albumin: 3.8 g/dL (ref 3.5–5.0)
Alkaline Phosphatase: 51 U/L (ref 38–126)
Bilirubin, Direct: 0.2 mg/dL (ref 0.1–0.5)
Indirect Bilirubin: 0.8 mg/dL (ref 0.3–0.9)
Total Bilirubin: 1 mg/dL (ref 0.3–1.2)
Total Protein: 8 g/dL (ref 6.5–8.1)

## 2017-06-29 LAB — POCT I-STAT TROPONIN I: Troponin i, poc: 0 ng/mL (ref 0.00–0.08)

## 2017-06-29 LAB — TROPONIN I: Troponin I: <0.03 ng/mL (ref <0.03)

## 2017-06-29 MED ORDER — TRAMADOL HCL 50 MG PO TABS: 50.0000 mg | ORAL_TABLET | Freq: Four times a day (QID) | ORAL | 0 refills | Status: DC | PRN

## 2017-06-29 NOTE — Discharge Instructions (Signed)
Follow up with your cardiologist in 2-3 weeks

## 2017-06-29 NOTE — ED Provider Notes (Signed)
[facility]Y DEPT Provider Note   CSN: [id number] Arrival date & time: 06/29/17  1229     History   Chief Complaint Chief Complaint  Patient presents with  . Chest Pain    HPI Adam Crosby is a 41 y.o. male.  Patient states that he has been having chest discomfort off-and-on for a number of weeks. He was evaluated 2 months ago by cardiology and it was felt his pain was noncardiac   The history is provided by the patient. No language interpreter was used.  Chest Pain   This is a new problem. The current episode started more than 2 days ago. The problem occurs constantly. The problem has not changed since onset.The pain is associated with exertion. The pain is present in the substernal region. The pain is at a severity of 3/10. The pain is moderate. The quality of the pain is described as dull. The pain does not radiate. Pertinent negatives include no abdominal pain, no back pain, no cough and no headaches.  Pertinent negatives for past medical history include no seizures.    Past Medical History:  Diagnosis Date  . Allergy   . Arthritis   . GERD (gastroesophageal reflux disease) 10/09/2016  . Hypertension   . OSA (obstructive sleep apnea) 10/22/2016    Patient Active Problem List   Diagnosis Date Noted  . Vitamin D deficiency 10/26/2016  . Severe obesity (BMI >= 40) (HCC) 10/22/2016  . OSA (obstructive sleep apnea) 10/22/2016  . GERD (gastroesophageal reflux disease) 10/09/2016  . Essential hypertension 09/23/2016    History reviewed. No pertinent surgical history.     Home Medications    Prior to Admission medications   Medication Sig Start Date End Date Taking? Authorizing Provider  Cholecalciferol (VITAMIN D3) 1000 units CAPS Take 1 capsule by mouth daily.   Yes [provider]  hydrochlorothiazide (HYDRODIURIL) 25 MG tablet Take 1 tablet (25 mg total) by mouth daily. 09/23/16  Yes Allayne Butcher B, PA-C  losartan (COZAAR) 50 MG tablet TAKE ONE  TABLET BY MOUTH ONCE DAILY 12/14/16  Yes Allayne Butcher B, PA-C  metoprolol succinate (TOPROL-XL) 25 MG 24 hr tablet Take 1 tablet (25 mg total) by mouth daily. 03/23/17  Yes Donita Brooks, MD  omeprazole (PRILOSEC) 20 MG capsule TAKE ONE CAPSULE BY MOUTH ONCE DAILY 02/25/17  Yes [place], Velna Hatchet, MD    Family History Family History  Problem Relation Age of Onset  . Arthritis Mother   . Hyperlipidemia Mother   . Hypertension Mother   . Varicose Veins Mother   . Diabetes Daughter   . Arthritis Maternal Grandmother   . Cancer Maternal Grandmother   . Diabetes Maternal Grandmother   . Varicose Veins Maternal Grandmother   . Arthritis Maternal Grandfather   . Cancer Maternal Grandfather   . Hearing loss Maternal Grandfather     Social History Social History  Substance Use Topics  . Smoking status: Never Smoker  . Smokeless tobacco: Never Used  . Alcohol use 0.6 oz/week    1 Cans of beer per week     Comment: EVERYOTHER MONTH     Allergies   Patient has no known allergies.   Review of Systems Review of Systems  Constitutional: Negative for appetite change and fatigue.  HENT: Negative for congestion, ear discharge and sinus pressure.   Eyes: Negative for discharge.  Respiratory: Negative for cough.   Cardiovascular: Positive for chest pain.  Gastrointestinal: Negative for abdominal pain and diarrhea.  Genitourinary: Negative  for frequency and hematuria.  Musculoskeletal: Negative for back pain.  Skin: Negative for rash.  Neurological: Negative for seizures and headaches.  Psychiatric/Behavioral: Negative for hallucinations.     Physical Exam Updated Vital Signs BP (!) 157/96   Pulse (!) 103   Temp 98.7 F (37.1 C) (Oral)   Ht 5\' 9"  (1.753 m)   Wt 127 kg (280 lb)   SpO2 96%   BMI 41.35 kg/m   Physical Exam  Constitutional: He is oriented to person, place, and time. He appears well-developed.  HENT:  Head: Normocephalic.  Eyes: Conjunctivae and EOM are  normal. No scleral icterus.  Neck: Neck supple. No thyromegaly present.  Cardiovascular: Normal rate and regular rhythm.  Exam reveals no gallop and no friction rub.   No murmur heard. Pulmonary/Chest: No stridor. He has no wheezes. He has no rales. He exhibits no tenderness.  Abdominal: He exhibits no distension. There is no tenderness. There is no rebound.  Musculoskeletal: Normal range of motion. He exhibits no edema.  Lymphadenopathy:    He has no cervical adenopathy.  Neurological: He is oriented to person, place, and time. He exhibits normal muscle tone. Coordination normal.  Skin: No rash noted. No erythema.  Psychiatric: He has a normal mood and affect. His behavior is normal.     ED Treatments / Results  Labs (all labs ordered are listed, but only abnormal results are displayed) Labs Reviewed  BASIC METABOLIC PANEL - Abnormal; Notable for the following:       Result Value   Potassium 2.9 (*)    Chloride 98 (*)    CO2 33 (*)    Glucose, Bld 120 (*)    All other components within normal limits  CBC  TROPONIN I  HEPATIC FUNCTION PANEL  POCT I-STAT TROPONIN I  I-STAT TROPONIN, ED    EKG  EKG Interpretation  Date/Time:  Tuesday June 29 2017 12:40:35 EDT Ventricular Rate:  103 PR Interval:  144 QRS Duration: 78 QT Interval:  346 QTC Calculation: 453 R Axis:   -19 Text Interpretation:  Sinus tachycardia Right atrial enlargement Borderline ECG Confirmed by Bethann Berkshire [phone]) on 06/29/2017 3:30:46 PM       Radiology Dg Chest 2 View  Result Date: 06/29/2017 CLINICAL DATA:  Chest pain on and off for 6 months. Shortness of breath. EXAM: CHEST  2 VIEW COMPARISON:  03/23/2017. FINDINGS: The heart size and mediastinal contours are within normal limits. Both lungs are clear. The visualized skeletal structures are unremarkable. IMPRESSION: No active cardiopulmonary disease.  No change from priors. Electronically Signed   By: Elsie Stain M.D.   On: 06/29/2017 13:00     Procedures Procedures (including critical care time)  Medications Ordered in ED Medications - No data to display   Initial Impression / Assessment and Plan / ED Course  I have reviewed the triage vital signs and the nursing notes.  Pertinent labs & imaging results that were available during my care of the patient were reviewed by me and considered in my medical decision making (see chart for details).   patient with noncardiac chest pain. He will be given some Ultram to help with the discomfort. And he will follow-up with either his family doctor or his cardiologist    Final Clinical Impressions(s) / ED Diagnoses   Final diagnoses:  None    New Prescriptions New Prescriptions   No medications on file     Bethann Berkshire, MD 06/29/17 1551

## 2017-06-29 NOTE — ED Triage Notes (Signed)
Off and on chest pain times 6 months.

## 2017-07-26 ENCOUNTER — Encounter: Payer: Self-pay | Admitting: Cardiology

## 2017-07-26 ENCOUNTER — Ambulatory Visit: Payer: 59 | Admitting: Cardiology

## 2017-07-26 NOTE — Progress Notes (Deleted)
Clinical Summary Adam Crosby is a 41 y.o.male  1. Chest pain - symptoms started about 6 months - can occur at rest or with activity. Aching pain midchest, 3/10 in severity. +SOB, no other symptoms. Can last several hours. Worst with position. No relation to food. Can be worst with deep breaths - can have some with DOE activities. No LE edema. Better with tylenol. Worst with movement.  - he reports at work does a lot of lifting with upper body, can trigger his symptoms.    - ER visit 06/2017 with chest pain -   CAD: HTN, borderline DM2 Past Medical History:  Diagnosis Date  . Allergy   . Arthritis   . GERD (gastroesophageal reflux disease) 10/09/2016  . Hypertension   . OSA (obstructive sleep apnea) 10/22/2016     No Known Allergies   Current Outpatient Prescriptions  Medication Sig Dispense Refill  . Cholecalciferol (VITAMIN D3) 1000 units CAPS Take 1 capsule by mouth daily.    . hydrochlorothiazide (HYDRODIURIL) 25 MG tablet Take 1 tablet (25 mg total) by mouth daily. 90 tablet 3  . losartan (COZAAR) 50 MG tablet TAKE ONE TABLET BY MOUTH ONCE DAILY 90 tablet 1  . metoprolol succinate (TOPROL-XL) 25 MG 24 hr tablet Take 1 tablet (25 mg total) by mouth daily. 90 tablet 3  . omeprazole (PRILOSEC) 20 MG capsule TAKE ONE CAPSULE BY MOUTH ONCE DAILY 30 capsule 3  . traMADol (ULTRAM) 50 MG tablet Take 1 tablet (50 mg total) by mouth every 6 (six) hours as needed. 20 tablet 0   No current facility-administered medications for this visit.      No past surgical history on file.   No Known Allergies    Family History  Problem Relation Age of Onset  . Arthritis Mother   . Hyperlipidemia Mother   . Hypertension Mother   . Varicose Veins Mother   . Diabetes Daughter   . Arthritis Maternal Grandmother   . Cancer Maternal Grandmother   . Diabetes Maternal Grandmother   . Varicose Veins Maternal Grandmother   . Arthritis Maternal Grandfather   . Cancer Maternal  Grandfather   . Hearing loss Maternal Grandfather      Social History Adam Crosby reports that he has never smoked. He has never used smokeless tobacco. Adam Crosby reports that he drinks about 0.6 oz of alcohol per week .   Review of Systems CONSTITUTIONAL: No weight loss, fever, chills, weakness or fatigue.  HEENT: Eyes: No visual loss, blurred vision, double vision or yellow sclerae.No hearing loss, sneezing, congestion, runny nose or sore throat.  SKIN: No rash or itching.  CARDIOVASCULAR:  RESPIRATORY: No shortness of breath, cough or sputum.  GASTROINTESTINAL: No anorexia, nausea, vomiting or diarrhea. No abdominal pain or blood.  GENITOURINARY: No burning on urination, no polyuria NEUROLOGICAL: No headache, dizziness, syncope, paralysis, ataxia, numbness or tingling in the extremities. No change in bowel or bladder control.  MUSCULOSKELETAL: No muscle, back pain, joint pain or stiffness.  LYMPHATICS: No enlarged nodes. No history of splenectomy.  PSYCHIATRIC: No history of depression or anxiety.  ENDOCRINOLOGIC: No reports of sweating, cold or heat intolerance. No polyuria or polydipsia.  Marland Kitchen   Physical Examination There were no vitals filed for this visit. There were no vitals filed for this visit.  Gen: resting comfortably, no acute distress HEENT: no scleral icterus, pupils equal round and reactive, no palptable cervical adenopathy,  CV Resp: Clear to auscultation bilaterally GI: abdomen is soft,  non-tender, non-distended, normal bowel sounds, no hepatosplenomegaly MSK: extremities are warm, no edema.  Skin: warm, no rash Neuro:  no focal deficits Psych: appropriate affect   Diagnostic Studies     Assessment and Plan   1. Chest pain - non cardiac chest pain. Would not recommend ischemic testing at this time - continue to monitor, if symptoms change over time can reconsider. - manage with prn tylenol and NSAIDs    F/u 3 months     Antoine Poche, M.D., F.A.C.C.

## 2017-07-29 ENCOUNTER — Other Ambulatory Visit: Payer: Self-pay | Admitting: Family Medicine

## 2017-07-29 ENCOUNTER — Other Ambulatory Visit: Payer: Self-pay | Admitting: Physician Assistant

## 2017-07-29 DIAGNOSIS — I10 Essential (primary) hypertension: Secondary | ICD-10-CM

## 2017-07-29 DIAGNOSIS — K219 Gastro-esophageal reflux disease without esophagitis: Secondary | ICD-10-CM

## 2017-07-30 NOTE — Telephone Encounter (Signed)
Patient has been discharged from practice.  Medication refill denied 

## 2017-11-16 DIAGNOSIS — M545 Low back pain: Secondary | ICD-10-CM | POA: Diagnosis not present

## 2018-01-30 ENCOUNTER — Observation Stay (HOSPITAL_COMMUNITY)
Admission: EM | Admit: 2018-01-30 | Discharge: 2018-02-01 | Disposition: A | Discharge status: Home / Self Care | Payer: BLUE CROSS/BLUE SHIELD | Attending: Orthopedic Surgery | Admitting: Orthopedic Surgery

## 2018-01-30 ENCOUNTER — Emergency Department (HOSPITAL_COMMUNITY): Payer: BLUE CROSS/BLUE SHIELD

## 2018-01-30 ENCOUNTER — Other Ambulatory Visit: Payer: Self-pay

## 2018-01-30 ENCOUNTER — Encounter (HOSPITAL_COMMUNITY): Payer: Self-pay

## 2018-01-30 DIAGNOSIS — S32008A Other fracture of unspecified lumbar vertebra, initial encounter for closed fracture: Principal | ICD-10-CM | POA: Insufficient documentation

## 2018-01-30 DIAGNOSIS — Z79899 Other long term (current) drug therapy: Secondary | ICD-10-CM | POA: Diagnosis not present

## 2018-01-30 DIAGNOSIS — Y9389 Activity, other specified: Secondary | ICD-10-CM | POA: Insufficient documentation

## 2018-01-30 DIAGNOSIS — I10 Essential (primary) hypertension: Secondary | ICD-10-CM | POA: Diagnosis not present

## 2018-01-30 DIAGNOSIS — Y929 Unspecified place or not applicable: Secondary | ICD-10-CM | POA: Insufficient documentation

## 2018-01-30 DIAGNOSIS — S32019A Unspecified fracture of first lumbar vertebra, initial encounter for closed fracture: Secondary | ICD-10-CM | POA: Diagnosis not present

## 2018-01-30 DIAGNOSIS — Y999 Unspecified external cause status: Secondary | ICD-10-CM | POA: Diagnosis not present

## 2018-01-30 DIAGNOSIS — S32009A Unspecified fracture of unspecified lumbar vertebra, initial encounter for closed fracture: Secondary | ICD-10-CM

## 2018-01-30 DIAGNOSIS — S22088A Other fracture of T11-T12 vertebra, initial encounter for closed fracture: Secondary | ICD-10-CM

## 2018-01-30 DIAGNOSIS — S3992XA Unspecified injury of lower back, initial encounter: Secondary | ICD-10-CM | POA: Diagnosis not present

## 2018-01-30 DIAGNOSIS — S32010A Wedge compression fracture of first lumbar vertebra, initial encounter for closed fracture: Secondary | ICD-10-CM | POA: Diagnosis not present

## 2018-01-30 DIAGNOSIS — S22089A Unspecified fracture of T11-T12 vertebra, initial encounter for closed fracture: Secondary | ICD-10-CM | POA: Diagnosis not present

## 2018-01-30 DIAGNOSIS — S32039A Unspecified fracture of third lumbar vertebra, initial encounter for closed fracture: Secondary | ICD-10-CM | POA: Diagnosis not present

## 2018-01-30 HISTORY — DX: Unspecified fracture of unspecified lumbar vertebra, initial encounter for closed fracture: S32.009A

## 2018-01-30 LAB — CBC WITH DIFFERENTIAL/PLATELET
Basophils Absolute: 0 10*3/uL (ref 0.0–0.1)
Basophils Relative: 0 %
Eosinophils Absolute: 0 10*3/uL (ref 0.0–0.7)
Eosinophils Relative: 0 %
HCT: 50.1 % (ref 39.0–52.0)
Hemoglobin: 15.7 g/dL (ref 13.0–17.0)
Lymphocytes Relative: 8 %
Lymphs Abs: 1 10*3/uL (ref 0.7–4.0)
MCH: 27.4 pg (ref 26.0–34.0)
MCHC: 31.3 g/dL (ref 30.0–36.0)
MCV: 87.4 fL (ref 78.0–100.0)
Monocytes Absolute: 0.5 10*3/uL (ref 0.1–1.0)
Monocytes Relative: 4 %
Neutro Abs: 10.6 10*3/uL — ABNORMAL HIGH (ref 1.7–7.7)
Neutrophils Relative %: 88 %
Platelets: 272 10*3/uL (ref 150–400)
RBC: 5.73 MIL/uL (ref 4.22–5.81)
RDW: 14.1 % (ref 11.5–15.5)
WBC: 12.1 10*3/uL — ABNORMAL HIGH (ref 4.0–10.5)

## 2018-01-30 LAB — BASIC METABOLIC PANEL
Anion gap: 12 (ref 5–15)
BUN: 13 mg/dL (ref 6–20)
CO2: 30 mmol/L (ref 22–32)
Calcium: 9.3 mg/dL (ref 8.9–10.3)
Chloride: 102 mmol/L (ref 101–111)
Creatinine, Ser: 1.09 mg/dL (ref 0.61–1.24)
GFR calc Af Amer: >60 mL/min (ref >60)
GFR calc non Af Amer: >60 mL/min (ref >60)
Glucose, Bld: 116 mg/dL — ABNORMAL HIGH (ref 65–99)
Potassium: 3.3 mmol/L — ABNORMAL LOW (ref 3.5–5.1)
Sodium: 144 mmol/L (ref 135–145)

## 2018-01-30 MED ORDER — HYDROMORPHONE HCL 1 MG/ML IJ SOLN
1.0000 mg | Freq: Once | INTRAMUSCULAR | Status: AC
  Administered 2018-01-30: 1 mg via INTRAMUSCULAR
  Filled 2018-01-30: qty 1

## 2018-01-30 MED ORDER — ONDANSETRON HCL 4 MG/2ML IJ SOLN
4.0000 mg | Freq: Three times a day (TID) | INTRAMUSCULAR | Status: DC | PRN
  Administered 2018-01-30 – 2018-01-31 (×2): 4 mg via INTRAVENOUS
  Filled 2018-01-30 (×2): qty 2

## 2018-01-30 MED ORDER — HYDROMORPHONE HCL 1 MG/ML IJ SOLN
1.0000 mg | INTRAMUSCULAR | Status: DC | PRN
  Administered 2018-01-30 – 2018-01-31 (×2): 1 mg via INTRAVENOUS
  Filled 2018-01-30 (×2): qty 1

## 2018-01-30 MED ORDER — DIAZEPAM 5 MG PO TABS
5.0000 mg | ORAL_TABLET | Freq: Once | ORAL | Status: AC
  Administered 2018-01-30: 5 mg via ORAL
  Filled 2018-01-30: qty 1

## 2018-01-30 MED ORDER — SODIUM CHLORIDE 0.9 % IV SOLN
INTRAVENOUS | Status: AC
  Administered 2018-01-30 – 2018-01-31 (×2): via INTRAVENOUS
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
## 2018-01-30 NOTE — ED Notes (Signed)
Unable to obtain blood from IV access 

## 2018-01-31 ENCOUNTER — Encounter (HOSPITAL_COMMUNITY): Payer: Self-pay | Admitting: Orthopedic Surgery

## 2018-01-31 DIAGNOSIS — S32030A Wedge compression fracture of third lumbar vertebra, initial encounter for closed fracture: Secondary | ICD-10-CM

## 2018-01-31 DIAGNOSIS — S32010A Wedge compression fracture of first lumbar vertebra, initial encounter for closed fracture: Secondary | ICD-10-CM | POA: Diagnosis not present

## 2018-01-31 DIAGNOSIS — S32008A Other fracture of unspecified lumbar vertebra, initial encounter for closed fracture: Secondary | ICD-10-CM | POA: Diagnosis not present

## 2018-01-31 MED ORDER — PANTOPRAZOLE SODIUM 40 MG PO TBEC
40.0000 mg | DELAYED_RELEASE_TABLET | Freq: Every day | ORAL | Status: DC
  Administered 2018-01-31: 40 mg via ORAL
  Filled 2018-01-31 (×2): qty 1

## 2018-01-31 MED ORDER — LOSARTAN POTASSIUM 50 MG PO TABS
50.0000 mg | ORAL_TABLET | Freq: Every day | ORAL | Status: DC
  Administered 2018-01-31: 50 mg via ORAL
  Filled 2018-01-31 (×2): qty 1

## 2018-01-31 MED ORDER — SODIUM CHLORIDE 0.9 % IV SOLN
INTRAVENOUS | Status: DC
  Administered 2018-01-31: 16:00:00 via INTRAVENOUS

## 2018-01-31 MED ORDER — ACETAMINOPHEN 325 MG PO TABS: 650.0000 mg | ORAL_TABLET | Freq: Four times a day (QID) | ORAL | Status: DC | PRN

## 2018-01-31 MED ORDER — TRAZODONE HCL 50 MG PO TABS: 25.0000 mg | ORAL_TABLET | Freq: Every evening | ORAL | Status: DC | PRN

## 2018-01-31 MED ORDER — KETOROLAC TROMETHAMINE 15 MG/ML IJ SOLN
15.0000 mg | Freq: Four times a day (QID) | INTRAMUSCULAR | Status: DC | PRN
  Administered 2018-01-31: 15 mg via INTRAVENOUS
  Filled 2018-01-31: qty 1

## 2018-01-31 MED ORDER — OXYCODONE HCL 5 MG PO TABS
5.0000 mg | ORAL_TABLET | ORAL | Status: DC | PRN
  Administered 2018-01-31 – 2018-02-01 (×4): 5 mg via ORAL
  Filled 2018-01-31 (×4): qty 1

## 2018-01-31 MED ORDER — ACETAMINOPHEN 650 MG RE SUPP: 650.0000 mg | Freq: Four times a day (QID) | RECTAL | Status: DC | PRN

## 2018-01-31 MED ORDER — DOCUSATE SODIUM 100 MG PO CAPS
100.0000 mg | ORAL_CAPSULE | Freq: Two times a day (BID) | ORAL | Status: DC
  Administered 2018-01-31: 100 mg via ORAL
  Filled 2018-01-31 (×3): qty 1

## 2018-01-31 MED ORDER — HYDROCHLOROTHIAZIDE 25 MG PO TABS
25.0000 mg | ORAL_TABLET | Freq: Every day | ORAL | Status: DC
  Administered 2018-01-31: 25 mg via ORAL
  Filled 2018-01-31 (×2): qty 1

## 2018-01-31 MED ORDER — METOPROLOL SUCCINATE ER 25 MG PO TB24
25.0000 mg | ORAL_TABLET | Freq: Every day | ORAL | Status: DC
  Administered 2018-01-31: 25 mg via ORAL
  Filled 2018-01-31 (×2): qty 1

## 2018-01-31 MED ORDER — VITAMIN D 1000 UNITS PO TABS
1000.0000 [IU] | ORAL_TABLET | Freq: Every day | ORAL | Status: DC
  Administered 2018-01-31: 1000 [IU] via ORAL
  Filled 2018-01-31 (×2): qty 1

## 2018-01-31 MED ORDER — POLYETHYLENE GLYCOL 3350 17 G PO PACK: 17.0000 g | PACK | Freq: Every day | ORAL | Status: DC | PRN

## 2018-01-31 NOTE — Progress Notes (Signed)
Patient was asleep and snoring when O2 sat was checked. Patient was 84% on room air. Patient woke up, stated that he had not yet gone for a sleep study but has been told he has sleep apnea. RT told patient to make sure he got that study set up and placed on nasal cannula at 2 lpm for night time.
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
## 2018-02-01 DIAGNOSIS — S32008A Other fracture of unspecified lumbar vertebra, initial encounter for closed fracture: Secondary | ICD-10-CM | POA: Diagnosis not present

## 2018-02-01 LAB — HIV ANTIBODY (ROUTINE TESTING W REFLEX): HIV Screen 4th Generation wRfx: NONREACTIVE

## 2018-02-01 MED ORDER — DOCUSATE SODIUM 100 MG PO CAPS: 100.0000 mg | ORAL_CAPSULE | Freq: Two times a day (BID) | ORAL | 0 refills | Status: DC

## 2018-02-01 MED ORDER — OXYCODONE HCL 5 MG PO TABS: 5.0000 mg | ORAL_TABLET | ORAL | 0 refills | Status: DC | PRN

## 2018-02-01 NOTE — Progress Notes (Signed)
Subjective: Patient denies any pain numbness or tingling back brace fit well but got hot at night when he worked to bed   Objective: Vital signs in last 24 hours: Temp:  [98.4 F (36.9 C)-98.7 F (37.1 C)] 98.7 F (37.1 C) (03/26 0612) Pulse Rate:  [88-91] 91 (03/26 0612) Resp:  [15-20] 15 (03/26 0612) BP: (125-127)/(58-71) 127/58 (03/26 0612) SpO2:  [84 %-96 %] 96 % (03/26 0612)  Intake/Output from previous day: 03/25 0701 - 03/26 0700 In: 817.5 [P.O.:240; I.V.:577.5] Out: -  Intake/Output this shift: No intake/output data recorded.  Recent Labs    01/30/18 2014  HGB 15.7   Recent Labs    01/30/18 2014  WBC 12.1*  RBC 5.73  HCT 50.1  PLT 272   Recent Labs    01/30/18 2014  NA 144  K 3.3*  CL 102  CO2 30  BUN 13  CREATININE 1.09  GLUCOSE 116*  CALCIUM 9.3   No results for input(s): LABPT, INR in the last 72 hours.  Neurologically intact ABD soft Neurovascular intact Sensation intact distally Intact pulses distally Dorsiflexion/Plantar flexion intact      Assessment/Plan: Stable for discharge   Fuller CanadaStanley Elba Schaber 02/01/2018, 7:53 AM

## 2018-02-01 NOTE — Discharge Instructions (Signed)
Out of work for 6 weeks
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
## 2018-02-04 ENCOUNTER — Encounter: Payer: Self-pay | Admitting: Orthopedic Surgery

## 2018-02-04 ENCOUNTER — Telehealth: Payer: Self-pay | Admitting: Orthopedic Surgery

## 2018-02-04 NOTE — Telephone Encounter (Signed)
Relayed to patient/spouse; has all the contact information regarding scheduling with Dr Venetia MaxonStern.

## 2018-02-04 NOTE — Telephone Encounter (Signed)
Patient's wife Joni Reiningicole, designated contact, came to office regarding form for patient's out of work status and about referral to neurosurgeon, said was discussed per Dr Romeo AppleHarrison at time of hospital discharge. Advised about forms process. Please advise regarding referral - ph#[phone].

## 2018-02-04 NOTE — Telephone Encounter (Signed)
He was advised to call Dr Venetia MaxonStern, information was sent to his office from the ER

## 2018-02-24 DIAGNOSIS — R030 Elevated blood-pressure reading, without diagnosis of hypertension: Secondary | ICD-10-CM | POA: Diagnosis not present

## 2018-02-24 DIAGNOSIS — Z6841 Body Mass Index (BMI) 40.0 and over, adult: Secondary | ICD-10-CM | POA: Diagnosis not present

## 2018-02-24 DIAGNOSIS — S32009A Unspecified fracture of unspecified lumbar vertebra, initial encounter for closed fracture: Secondary | ICD-10-CM | POA: Diagnosis not present

## 2018-02-24 DIAGNOSIS — I10 Essential (primary) hypertension: Secondary | ICD-10-CM | POA: Diagnosis not present

## 2018-03-28 DIAGNOSIS — Z6841 Body Mass Index (BMI) 40.0 and over, adult: Secondary | ICD-10-CM | POA: Diagnosis not present

## 2018-03-28 DIAGNOSIS — S32009A Unspecified fracture of unspecified lumbar vertebra, initial encounter for closed fracture: Secondary | ICD-10-CM | POA: Diagnosis not present

## 2018-03-28 DIAGNOSIS — I10 Essential (primary) hypertension: Secondary | ICD-10-CM | POA: Diagnosis not present

## 2018-04-28 DIAGNOSIS — S32009A Unspecified fracture of unspecified lumbar vertebra, initial encounter for closed fracture: Secondary | ICD-10-CM | POA: Diagnosis not present

## 2018-05-26 DIAGNOSIS — S32009A Unspecified fracture of unspecified lumbar vertebra, initial encounter for closed fracture: Secondary | ICD-10-CM | POA: Diagnosis not present

## 2018-05-26 DIAGNOSIS — I10 Essential (primary) hypertension: Secondary | ICD-10-CM | POA: Diagnosis not present

## 2018-05-26 DIAGNOSIS — Z6841 Body Mass Index (BMI) 40.0 and over, adult: Secondary | ICD-10-CM | POA: Diagnosis not present

## 2018-06-08 DIAGNOSIS — Z1389 Encounter for screening for other disorder: Secondary | ICD-10-CM | POA: Diagnosis not present

## 2018-06-08 DIAGNOSIS — I10 Essential (primary) hypertension: Secondary | ICD-10-CM | POA: Diagnosis not present

## 2018-06-08 DIAGNOSIS — R0683 Snoring: Secondary | ICD-10-CM | POA: Diagnosis not present

## 2018-06-08 DIAGNOSIS — Z6841 Body Mass Index (BMI) 40.0 and over, adult: Secondary | ICD-10-CM | POA: Diagnosis not present

## 2018-06-22 DIAGNOSIS — I10 Essential (primary) hypertension: Secondary | ICD-10-CM | POA: Diagnosis not present

## 2018-06-22 DIAGNOSIS — Z6841 Body Mass Index (BMI) 40.0 and over, adult: Secondary | ICD-10-CM | POA: Diagnosis not present

## 2018-08-22 DIAGNOSIS — S32009A Unspecified fracture of unspecified lumbar vertebra, initial encounter for closed fracture: Secondary | ICD-10-CM | POA: Diagnosis not present

## 2018-08-30 ENCOUNTER — Other Ambulatory Visit: Payer: Self-pay | Admitting: Neurosurgery

## 2018-08-30 DIAGNOSIS — S32008A Other fracture of unspecified lumbar vertebra, initial encounter for closed fracture: Secondary | ICD-10-CM

## 2018-10-29 ENCOUNTER — Ambulatory Visit
Admission: RE | Admit: 2018-10-29 | Discharge: 2018-10-29 | Disposition: A | Discharge status: Home / Self Care | Payer: BLUE CROSS/BLUE SHIELD | Source: Ambulatory Visit | Attending: Neurosurgery | Admitting: Neurosurgery

## 2018-10-29 DIAGNOSIS — M545 Low back pain: Secondary | ICD-10-CM | POA: Diagnosis not present

## 2018-10-29 DIAGNOSIS — S32008A Other fracture of unspecified lumbar vertebra, initial encounter for closed fracture: Secondary | ICD-10-CM

## 2018-11-21 DIAGNOSIS — I10 Essential (primary) hypertension: Secondary | ICD-10-CM | POA: Diagnosis not present

## 2018-11-21 DIAGNOSIS — Z6841 Body Mass Index (BMI) 40.0 and over, adult: Secondary | ICD-10-CM | POA: Diagnosis not present

## 2018-11-21 DIAGNOSIS — S32009A Unspecified fracture of unspecified lumbar vertebra, initial encounter for closed fracture: Secondary | ICD-10-CM | POA: Diagnosis not present

## 2019-01-04 DIAGNOSIS — R51 Headache: Secondary | ICD-10-CM | POA: Diagnosis not present

## 2019-01-04 DIAGNOSIS — J019 Acute sinusitis, unspecified: Secondary | ICD-10-CM | POA: Diagnosis not present

## 2019-01-04 DIAGNOSIS — Z6841 Body Mass Index (BMI) 40.0 and over, adult: Secondary | ICD-10-CM | POA: Diagnosis not present

## 2019-01-04 DIAGNOSIS — Z1389 Encounter for screening for other disorder: Secondary | ICD-10-CM | POA: Diagnosis not present

## 2019-01-04 DIAGNOSIS — R05 Cough: Secondary | ICD-10-CM | POA: Diagnosis not present

## 2019-01-04 DIAGNOSIS — F329 Major depressive disorder, single episode, unspecified: Secondary | ICD-10-CM | POA: Diagnosis not present

## 2019-01-04 DIAGNOSIS — R42 Dizziness and giddiness: Secondary | ICD-10-CM | POA: Diagnosis not present

## 2019-06-01 DIAGNOSIS — J45909 Unspecified asthma, uncomplicated: Secondary | ICD-10-CM | POA: Diagnosis not present

## 2019-06-01 DIAGNOSIS — Z1389 Encounter for screening for other disorder: Secondary | ICD-10-CM | POA: Diagnosis not present

## 2019-06-01 DIAGNOSIS — Z6841 Body Mass Index (BMI) 40.0 and over, adult: Secondary | ICD-10-CM | POA: Diagnosis not present

## 2019-06-01 DIAGNOSIS — R05 Cough: Secondary | ICD-10-CM | POA: Diagnosis not present

## 2019-09-07 DIAGNOSIS — Z6841 Body Mass Index (BMI) 40.0 and over, adult: Secondary | ICD-10-CM | POA: Diagnosis not present

## 2019-09-07 DIAGNOSIS — I10 Essential (primary) hypertension: Secondary | ICD-10-CM | POA: Diagnosis not present

## 2020-05-19 ENCOUNTER — Ambulatory Visit
Admission: EM | Admit: 2020-05-19 | Discharge: 2020-05-19 | Disposition: A | Discharge status: Home / Self Care | Payer: BLUE CROSS/BLUE SHIELD | Attending: Emergency Medicine | Admitting: Emergency Medicine

## 2020-05-19 ENCOUNTER — Other Ambulatory Visit: Payer: Self-pay

## 2020-05-19 ENCOUNTER — Encounter (HOSPITAL_COMMUNITY): Payer: Self-pay | Admitting: *Deleted

## 2020-05-19 ENCOUNTER — Emergency Department (HOSPITAL_COMMUNITY)
Admission: EM | Admit: 2020-05-19 | Discharge: 2020-05-19 | Disposition: A | Discharge status: Home / Self Care | Payer: Self-pay | Attending: Emergency Medicine | Admitting: Emergency Medicine

## 2020-05-19 ENCOUNTER — Emergency Department (HOSPITAL_COMMUNITY): Payer: Self-pay

## 2020-05-19 DIAGNOSIS — R0789 Other chest pain: Secondary | ICD-10-CM | POA: Insufficient documentation

## 2020-05-19 DIAGNOSIS — R110 Nausea: Secondary | ICD-10-CM | POA: Insufficient documentation

## 2020-05-19 DIAGNOSIS — Z79899 Other long term (current) drug therapy: Secondary | ICD-10-CM | POA: Insufficient documentation

## 2020-05-19 DIAGNOSIS — I10 Essential (primary) hypertension: Secondary | ICD-10-CM | POA: Insufficient documentation

## 2020-05-19 DIAGNOSIS — R202 Paresthesia of skin: Secondary | ICD-10-CM | POA: Insufficient documentation

## 2020-05-19 LAB — BASIC METABOLIC PANEL
Anion gap: 9 (ref 5–15)
BUN: 14 mg/dL (ref 6–20)
CO2: 32 mmol/L (ref 22–32)
Calcium: 8.7 mg/dL — ABNORMAL LOW (ref 8.9–10.3)
Chloride: 100 mmol/L (ref 98–111)
Creatinine, Ser: 1.29 mg/dL — ABNORMAL HIGH (ref 0.61–1.24)
GFR calc Af Amer: >60 mL/min (ref >60)
GFR calc non Af Amer: >60 mL/min (ref >60)
Glucose, Bld: 146 mg/dL — ABNORMAL HIGH (ref 70–99)
Potassium: 2.9 mmol/L — ABNORMAL LOW (ref 3.5–5.1)
Sodium: 141 mmol/L (ref 135–145)

## 2020-05-19 LAB — CBC
HCT: 53.6 % — ABNORMAL HIGH (ref 39.0–52.0)
Hemoglobin: 16.7 g/dL (ref 13.0–17.0)
MCH: 27.3 pg (ref 26.0–34.0)
MCHC: 31.2 g/dL (ref 30.0–36.0)
MCV: 87.7 fL (ref 80.0–100.0)
Platelets: 331 10*3/uL (ref 150–400)
RBC: 6.11 MIL/uL — ABNORMAL HIGH (ref 4.22–5.81)
RDW: 13.6 % (ref 11.5–15.5)
WBC: 6.7 10*3/uL (ref 4.0–10.5)
nRBC: 0 % (ref 0.0–0.2)

## 2020-05-19 LAB — D-DIMER, QUANTITATIVE: D-Dimer, Quant: <0.27 ug/mL-FEU (ref 0.00–0.50)

## 2020-05-19 LAB — TROPONIN I (HIGH SENSITIVITY)
Troponin I (High Sensitivity): 8 ng/L (ref <18)
Troponin I (High Sensitivity): 7 ng/L (ref <18)

## 2020-05-19 MED ORDER — POTASSIUM CHLORIDE 10 MEQ/100ML IV SOLN
10.0000 meq | Freq: Once | INTRAVENOUS | Status: AC
  Administered 2020-05-19: 10 meq via INTRAVENOUS
  Filled 2020-05-19: qty 100

## 2020-05-19 MED ORDER — ASPIRIN 81 MG PO CHEW
324.0000 mg | CHEWABLE_TABLET | Freq: Once | ORAL | Status: AC
  Administered 2020-05-19: 324 mg via ORAL

## 2020-05-19 MED ORDER — SODIUM CHLORIDE 0.9 % IV BOLUS
500.0000 mL | Freq: Once | INTRAVENOUS | Status: AC
  Administered 2020-05-19: 500 mL via INTRAVENOUS

## 2020-05-19 MED ORDER — POTASSIUM CHLORIDE CRYS ER 20 MEQ PO TBCR
40.0000 meq | EXTENDED_RELEASE_TABLET | Freq: Once | ORAL | Status: AC
  Administered 2020-05-19: 40 meq via ORAL
  Filled 2020-05-19: qty 2

## 2020-05-19 NOTE — ED Triage Notes (Signed)
Advised to go to the ER for further evaluation for chest pain.

## 2020-05-19 NOTE — ED Triage Notes (Signed)
Patient presents to the ED after seen at [facility] urgent care today for intermittent chest pain for one month. Patient reports pain worse today since waking.  Patient does not report a cardiac history.

## 2020-05-19 NOTE — Discharge Instructions (Addendum)
  You were evaluated in the emergency department for chest pain.    Work up was normal  Based on your risk factors, work up and exam you are considered low risk for major adverse cardiac events in the next 30 days.  This means you can be discharged with close follow up with follow up with primary care doctor or cardiology for further outpatient work up as needed, if your chest discomfort continues  Call your primary care doctor or cardiologist as soon as possible to establish care and further discussion and work up of your symptoms on an outpatient setting  Please return to ED if: Your chest pain is worse or on exertion You have a cough that gets worse, or you cough up blood. You have severe pain in chest, back or abdomen. You have chest pain with loss of sensation, weakness or tingling in extremities You have chest pain or shortness of breath with exertion or activity You have sudden, unexplained discomfort in your chest, with radiation arms, back, neck, or jaw. You suddenly have chest pain and begin to sweat, or your skin gets clammy. You feel chest pain with nausea or vomiting, blood in vomit You suddenly feel light-headed or faint. Your heart begins to beat quickly, or it feels like it is skipping beats. You have one sided leg swelling or calf pain You have chest pain with fever, chills, cough or other viral symptoms

## 2020-05-19 NOTE — ED Triage Notes (Signed)
Chest pressure since Friday.  EKG complete.  324 mg baby aspirin given.

## 2020-05-19 NOTE — ED Notes (Signed)
Patient came out of room visibly upset stating " I have to get out of here, take this out, pointing to IV catheter,.  Patient stated " I am stressed out have to leave".  Patient goes on to say its not the ED  And that is was his spouse who had been visiting patient that was upsetting him at this time.  Patient given discharge papers and walked out of unit.

## 2020-05-19 NOTE — ED Provider Notes (Signed)
[facility] CARE CENTER   [id number] 05/19/20 Arrival Time: 1118   CC: CHEST pressure  SUBJECTIVE:  Adam Crosby is a 44 y.o. male hx significant for HTN, GERD, allergies, arthritis, and OSA, who presents with complaint of chest pressure and nausea x 2 days.  Admits to stress at work and in marriage.  Also ran out of blood pressure medication recently.  Localizes chest pain to the substernal region.  Describes as mildly improving, that is constant and pressure in character.  Reported to front desk staff that it felt like something was sitting on his chest.  Denies alleviating factors.  Symptoms made worse with activity.  Denies radiating symptoms.  Denies previous symptoms in the past.  Complains of intermittent chills x 1 week.  Denies fever, lightheadedness, dizziness, palpitations, tachycardia, SOB, vomiting, abdominal pain, changes in bowel or bladder habits, diaphoresis, numbness/tingling in extremities, peripheral edema, or anxiety.    Denies close relatives (mother, father, siblings) with heart attack or stroke; does admit to HTN in the family  ROS: As per HPI.  All other pertinent ROS negative.    Past Medical History:  Diagnosis Date  . Allergy   . Arthritis   . GERD (gastroesophageal reflux disease) 10/09/2016  . Hypertension   . Lumbar vertebral fracture (HCC) 01/30/2018  . OSA (obstructive sleep apnea) 10/22/2016   No past surgical history on file. Not on File No current facility-administered medications on file prior to encounter.   Current Outpatient Medications on File Prior to Encounter  Medication Sig Dispense Refill  . Cholecalciferol (VITAMIN D3) 1000 units CAPS Take 1 capsule by mouth daily.    Marland Kitchen docusate sodium (COLACE) 100 MG capsule Take 1 capsule (100 mg total) by mouth 2 (two) times daily. 10 capsule 0  . hydrochlorothiazide (HYDRODIURIL) 25 MG tablet Take 1 tablet (25 mg total) by mouth daily. (Patient not taking: Reported on 01/30/2018) 90 tablet 3  .  losartan (COZAAR) 50 MG tablet TAKE ONE TABLET BY MOUTH ONCE DAILY (Patient not taking: Reported on 01/30/2018) 90 tablet 1  . metoprolol succinate (TOPROL-XL) 25 MG 24 hr tablet Take 1 tablet (25 mg total) by mouth daily. (Patient not taking: Reported on 01/30/2018) 90 tablet 3  . omeprazole (PRILOSEC) 20 MG capsule TAKE ONE CAPSULE BY MOUTH ONCE DAILY (Patient not taking: Reported on 01/30/2018) 30 capsule 3  . oxyCODONE (OXY IR/ROXICODONE) 5 MG immediate release tablet Take 1 tablet (5 mg total) by mouth every 4 (four) hours as needed for moderate pain. 30 tablet 0   Social History   Socioeconomic History  . Marital status: Married    Spouse name: Not on file  . Number of children: Not on file  . Years of education: Not on file  . Highest education level: Not on file  Occupational History  . Not on file  Tobacco Use  . Smoking status: Never Smoker  . Smokeless tobacco: Never Used  Substance and Sexual Activity  . Alcohol use: Yes    Alcohol/week: 1.0 standard drink    Types: 1 Cans of beer per week    Comment: EVERYOTHER MONTH  . Drug use: No  . Sexual activity: Yes  Other Topics Concern  . Not on file  Social History Narrative  . Not on file   Social Determinants of Health   Financial Resource Strain:   . Difficulty of Paying Living Expenses:   Food Insecurity:   . Worried About Programme researcher, broadcasting/film/video in the Last Year:   .  Ran Out of Food in the Last Year:   Transportation Needs:   . Freight forwarder (Medical):   Marland Kitchen Lack of Transportation (Non-Medical):   Physical Activity:   . Days of Exercise per Week:   . Minutes of Exercise per Session:   Stress:   . Feeling of Stress :   Social Connections:   . Frequency of Communication with Friends and Family:   . Frequency of Social Gatherings with Friends and Family:   . Attends Religious Services:   . Active Member of Clubs or Organizations:   . Attends Banker Meetings:   Marland Kitchen Marital Status:   Intimate  Partner Violence:   . Fear of Current or Ex-Partner:   . Emotionally Abused:   Marland Kitchen Physically Abused:   . Sexually Abused:    Family History  Problem Relation Age of Onset  . Arthritis Mother   . Hyperlipidemia Mother   . Hypertension Mother   . Varicose Veins Mother   . Diabetes Daughter   . Arthritis Maternal Grandmother   . Cancer Maternal Grandmother   . Diabetes Maternal Grandmother   . Varicose Veins Maternal Grandmother   . Arthritis Maternal Grandfather   . Cancer Maternal Grandfather   . Hearing loss Maternal Grandfather      OBJECTIVE:  Vitals:   05/19/20 1137  BP: (!) 146/92  Pulse: (!) 119  Resp: 20  Temp: 98.8 F (37.1 C)  TempSrc: Oral  SpO2: 94%    General appearance: alert; no distress Eyes: PERRLA; EOMI; conjunctiva normal HENT: normocephalic; atraumatic Neck: supple Lungs: clear to auscultation bilaterally without adventitious breath sounds Heart: regular rate and rhythm.  Clear S1 and S2 without rubs, gallops, or murmur. Abdomen: soft, non-tender; bowel sounds normal; no guarding or rebound tenderness Extremities: no cyanosis or edema; symmetrical with no gross deformities Skin: warm and dry Psychological: alert and cooperative; normal mood and affect  ECG: Orders placed or performed during the hospital encounter of 06/29/17  . EKG 12-Lead  . EKG 12-Lead  . ED EKG within 10 minutes  . ED EKG within 10 minutes  . EKG 12-Lead  . EKG 12-Lead  . EKG   EKG with sinus tachycardia without ST elevations, depressions, or prolonged PR interval.  No narrowing or widening of the QRS complexes.  Comparable to past EKG.    ASSESSMENT & PLAN:  1. Chest pressure    Meds ordered this encounter  Medications  . aspirin chewable tablet 324 mg   324 mg of aspirin given in office Based on HPI I am concerned for cardiac cause.  Recommending further evaluation and management in the ED. Patient aware and in agreement.  Will travel by private vehicle to [facility] ED.  Declines EMS transport at this time.    Chest pain precautions given. Reviewed expectations re: course of current medical issues. Questions answered. Outlined signs and symptoms indicating need for more acute intervention. Patient verbalized understanding. After Visit Summary given.   Rennis Harding, PA-C 05/19/20 1144

## 2020-05-19 NOTE — ED Provider Notes (Signed)
[facility] EMERGENCY DEPARTMENT Provider Note   CSN: [id number] Arrival date & time: 05/19/20  1153     History Chief Complaint  Patient presents with  . Chest Pain    Adam Crosby is a 44 y.o. male with history of chronic back pain presents to the ED from urgent care for evaluation of chest pain.  He describes substernal chest pressure that began in April.  Nonradiating.  Happens almost daily.  Sometimes has associated nausea, bilateral hand tingling.  Sometimes it feels like it goes down through his back but states he has history of 7 broken vertebrae and has chronic back pain so unsure if this is related.  He works in a Horticulturist, commercial, states sometimes if he is exerting himself he will have some chest pressure.  He also feels like if he is stressed or anxious about stuff he feels with chest pressure start.  He last had chest pressure prior to arrival at urgent care.  No longer having chest discomfort.  Admits to recent increase in stress at home and at work.  He used to take 600 mg of ibuprofen frequently for his chronic back pain but he stopped because it was causing him stomach issues. No associated palpitations, lightheadedness, syncope, shortness of breath, arm or neck pain.  Denies associated fever, cough, shortness of breath.  Denies known personal history of CAD.  Denies family history of CAD.  Denies personal history of blood clots, recent surgery or prolonged immobilization, leg pain or calf swelling.  Has been noncompliant with high blood pressure medicines for months.  Unknown alleviating factors.  Denies having any issues with acid reflux, postprandial abdominal or chest pain.  No tobacco use.  HPI     Past Medical History:  Diagnosis Date  . Allergy   . Arthritis   . GERD (gastroesophageal reflux disease) 10/09/2016  . Hypertension   . Lumbar vertebral fracture (HCC) 01/30/2018  . OSA (obstructive sleep apnea) 10/22/2016    Patient Active Problem List    Diagnosis Date Noted  . Compression fracture of L1 lumbar vertebra (HCC) 01/31/2018  . Lumbar vertebral fracture (HCC) 01/30/2018  . Vitamin D deficiency 10/26/2016  . Severe obesity (BMI >= 40) (HCC) 10/22/2016  . OSA (obstructive sleep apnea) 10/22/2016  . GERD (gastroesophageal reflux disease) 10/09/2016  . Essential hypertension 09/23/2016    History reviewed. No pertinent surgical history.     Family History  Problem Relation Age of Onset  . Arthritis Mother   . Hyperlipidemia Mother   . Hypertension Mother   . Varicose Veins Mother   . Diabetes Daughter   . Arthritis Maternal Grandmother   . Cancer Maternal Grandmother   . Diabetes Maternal Grandmother   . Varicose Veins Maternal Grandmother   . Arthritis Maternal Grandfather   . Cancer Maternal Grandfather   . Hearing loss Maternal Grandfather     Social History   Tobacco Use  . Smoking status: Never Smoker  . Smokeless tobacco: Never Used  Substance Use Topics  . Alcohol use: Yes    Alcohol/week: 1.0 standard drink    Types: 1 Cans of beer per week    Comment: EVERYOTHER MONTH  . Drug use: No    Home Medications Prior to Admission medications   Medication Sig Start Date End Date Taking? Authorizing Provider  hydrochlorothiazide (HYDRODIURIL) 25 MG tablet Take 1 tablet (25 mg total) by mouth daily. Patient not taking: Reported on 01/30/2018 09/23/16 05/19/20  Dorena Bodo, PA-C  losartan (COZAAR) 50 MG tablet TAKE ONE TABLET BY MOUTH ONCE DAILY Patient not taking: Reported on 01/30/2018 12/14/16 05/19/20  Allayne Butcher B, PA-C  metoprolol succinate (TOPROL-XL) 25 MG 24 hr tablet Take 1 tablet (25 mg total) by mouth daily. Patient not taking: Reported on 01/30/2018 03/23/17 05/19/20  Donita Brooks, MD  omeprazole (PRILOSEC) 20 MG capsule TAKE ONE CAPSULE BY MOUTH ONCE DAILY Patient not taking: Reported on 01/30/2018 02/25/17 05/19/20  Salley Scarlet, MD    Allergies    Patient has no known allergies.  Review  of Systems   Review of Systems  Cardiovascular: Positive for chest pain.  Gastrointestinal: Positive for nausea.  All other systems reviewed and are negative.   Physical Exam Updated Vital Signs BP (!) 118/97   Pulse 99   Temp 98.8 F (37.1 C) (Oral)   Resp 18   Ht 5\' 9"  (1.753 m)   Wt 119.7 kg   SpO2 96%   BMI 38.99 kg/m   Physical Exam Constitutional:      Appearance: He is well-developed.     Comments: NAD. Non toxic.   HENT:     Head: Normocephalic and atraumatic.     Nose: Nose normal.  Eyes:     General: Lids are normal.     Conjunctiva/sclera: Conjunctivae normal.  Neck:     Trachea: Trachea normal.     Comments: Trachea midline.  Cardiovascular:     Rate and Rhythm: Normal rate and regular rhythm.     Pulses:          Radial pulses are 1+ on the right side and 1+ on the left side.       Dorsalis pedis pulses are 1+ on the right side and 1+ on the left side.     Heart sounds: Normal heart sounds, S1 normal and S2 normal.     Comments: No murmurs. No LE edema or calf tenderness.  Pulmonary:     Effort: Pulmonary effort is normal.     Breath sounds: Normal breath sounds.  Abdominal:     General: Bowel sounds are normal.     Palpations: Abdomen is soft.     Tenderness: There is no abdominal tenderness.     Comments: No epigastric or upper abdominal tenderness.  Musculoskeletal:     Cervical back: Normal range of motion.  Skin:    General: Skin is warm and dry.     Capillary Refill: Capillary refill takes less than 2 seconds.     Comments: No rash to chest wall  Neurological:     Mental Status: He is alert.     GCS: GCS eye subscore is 4. GCS verbal subscore is 5. GCS motor subscore is 6.     Comments: Sensation and strength intact in upper/lower extremities  Psychiatric:        Speech: Speech normal.        Behavior: Behavior normal.        Thought Content: Thought content normal.     ED Results / Procedures / Treatments   Labs (all labs ordered  are listed, but only abnormal results are displayed) Labs Reviewed  BASIC METABOLIC PANEL - Abnormal; Notable for the following components:      Result Value   Potassium 2.9 (*)    Glucose, Bld 146 (*)    Creatinine, Ser 1.29 (*)    Calcium 8.7 (*)    All other components within normal limits  CBC - Abnormal; Notable  for the following components:   RBC 6.11 (*)    HCT 53.6 (*)    All other components within normal limits  D-DIMER, QUANTITATIVE (NOT AT [facility])  TROPONIN I (HIGH SENSITIVITY)  TROPONIN I (HIGH SENSITIVITY)    EKG EKG Interpretation  Date/Time:  Sunday May 19 2020 12:05:50 EDT Ventricular Rate:  116 PR Interval:    QRS Duration: 80 QT Interval:  334 QTC Calculation: 464 R Axis:   -4 Text Interpretation: Sinus tachycardia Atrial premature complex LAE, consider biatrial enlargement No significant change since last tracing Confirmed by Jacalyn Lefevre [phone]) on 05/19/2020 12:24:25 PM   Radiology DG Chest Port 1 View  Result Date: 05/19/2020 CLINICAL DATA:  Chest pain EXAM: PORTABLE CHEST 1 VIEW COMPARISON:  June 29, 2017 FINDINGS: Lungs are clear. Heart size and pulmonary vascularity are normal. No adenopathy. No pneumothorax. No bone lesions. IMPRESSION: Lungs clear.  Cardiac silhouette normal. Electronically Signed   By: Bretta Bang III M.D.   On: 05/19/2020 13:32    Procedures Procedures (including critical care time)  Medications Ordered in ED Medications  sodium chloride 0.9 % bolus 500 mL (0 mLs Intravenous Stopped 05/19/20 1416)  potassium chloride 10 mEq in 100 mL IVPB (0 mEq Intravenous Stopped 05/19/20 1616)  potassium chloride SA (KLOR-CON) CR tablet 40 mEq (40 mEq Oral Given 05/19/20 1412)    ED Course  I have reviewed the triage vital signs and the nursing notes.  Pertinent labs & imaging results that were available during my care of the patient were reviewed by me and considered in my medical decision making (see chart for  details).  Clinical Course as of May 19 1618  Wynelle Link May 19, 2020  1405 Hemoglobin: 16.7 [CG]  1405 D-Dimer, Quant: <0.27 [CG]  1405 Potassium(!): 2.9 [CG]  1405 Creatinine(!): 1.29 [CG]  1406 Sinus tachycardia Atrial premature complex LAE, consider biatrial enlargement No significant change since last tracing Confirmed by Jacalyn Lefevre [phone]) on 05/19/2020 12:24:25 PM  EKG 12-Lead [CG]  1406 Pulse Rate: 99 [CG]    Clinical Course User Index [CG] Liberty Handy, PA-C   MDM Rules/Calculators/A&P                          I obtained additional history from triage, nursing notes and review of medical chart.  Previous medical records available, nursing notes reviewed to obtain more history and assist with MDM.  EKG done in urgent care reviewed, showed sinus tachycardia but otherwise normal.  Chief complain involves an extensive number of treatment options and is a complaint that carries with it a high risk of complications and morbidity and mortality.    Has to my differential diagnosis is atypical chest pain including MSK, GERD.  Anxiety and stress could be contributing.  His cardiac risk factors include elevated BMI, hypertension.  He is tachycardic but otherwise does not have any risk factors for PE.  No neuro pulse deficits to suggest dissection.  No epigastric or postprandial pain to suggest ulcer, perforation, pancreatitis.  No infectious symptoms.  I ordered laboratory studies including troponin x2, D-dimer.  I ordered chest x-ray, EKG, continuous cardiac monitoring and pulse ox.  We will give small IV fluid bolus for his tachycardia.  ER work-up personally reviewed and interpreted.  D-dimer is negative.  Troponin x2 undetectable.  EKG initially showing sinus tachycardia but no ST depressions, prolonged intervals, signs of right ventricular strain or pericarditis.  I reevaluated patient.  His tachycardia  improved after IV fluids.  Has been asymptomatic.  His heart score is  less than 3.  Given this, benign work-up today is appropriate for discharge with PCP follow-up.  Clinical presentation is not consistent with ACS, PE, dissection.  Return precautions discussed.  Is comfortable with this plan Final Clinical Impression(s) / ED Diagnoses Final diagnoses:  Atypical chest pain    Rx / DC Orders ED Discharge Orders    None       Liberty HandyGibbons, Evalynn Hankins J, PA-C 05/19/20 1619    Jacalyn LefevreHaviland, Julie, MD 05/21/20 1150

## 2020-05-19 NOTE — Discharge Instructions (Signed)
324 mg of aspirin given in office Based on HPI I am concerned for cardiac cause.  Recommending further evaluation and management in the ED. Patient aware and in agreement.  Will travel by private vehicle to [facility] ED.  Declines EMS transport at this time.

## 2020-08-19 ENCOUNTER — Ambulatory Visit
Admission: EM | Admit: 2020-08-19 | Discharge: 2020-08-19 | Disposition: A | Discharge status: Home / Self Care | Payer: 59

## 2020-08-19 ENCOUNTER — Ambulatory Visit: Payer: Self-pay

## 2020-08-19 ENCOUNTER — Other Ambulatory Visit: Payer: Self-pay

## 2020-08-19 DIAGNOSIS — J069 Acute upper respiratory infection, unspecified: Secondary | ICD-10-CM

## 2020-08-19 DIAGNOSIS — Z1152 Encounter for screening for COVID-19: Secondary | ICD-10-CM

## 2020-08-19 DIAGNOSIS — Z20822 Contact with and (suspected) exposure to covid-19: Secondary | ICD-10-CM

## 2020-08-19 MED ORDER — BENZONATATE 100 MG PO CAPS: 100.0000 mg | ORAL_CAPSULE | Freq: Three times a day (TID) | ORAL | 0 refills | Status: DC

## 2020-08-19 NOTE — Discharge Instructions (Signed)

## 2020-08-19 NOTE — ED Provider Notes (Signed)
[facility]   [id number] 08/19/20 Arrival Time: 1647   CC: COVID symptoms  SUBJECTIVE: History from: patient.  Adam Crosby is a 44 y.o. male who presents with runny nose, congestion and cough x 2 days.  Denies sick exposure to COVID, flu or strep.   Has tried OTC medications without relief.  Denies aggravating factors.  Denies previous symptoms in the past.   Denies fever, chills,SOB, wheezing, chest pain, nausea, changes in bowel or bladder habits.    ROS: As per HPI.  All other pertinent ROS negative.     Past Medical History:  Diagnosis Date  . Allergy   . Arthritis   . GERD (gastroesophageal reflux disease) 10/09/2016  . Hypertension   . Lumbar vertebral fracture (HCC) 01/30/2018  . OSA (obstructive sleep apnea) 10/22/2016   History reviewed. No pertinent surgical history. No Known Allergies No current facility-administered medications on file prior to encounter.   Current Outpatient Medications on File Prior to Encounter  Medication Sig Dispense Refill  . losartan (COZAAR) 50 MG tablet Take 50 mg by mouth daily.    . [DISCONTINUED] hydrochlorothiazide (HYDRODIURIL) 25 MG tablet Take 1 tablet (25 mg total) by mouth daily. (Patient not taking: Reported on 01/30/2018) 90 tablet 3  . [DISCONTINUED] metoprolol succinate (TOPROL-XL) 25 MG 24 hr tablet Take 1 tablet (25 mg total) by mouth daily. (Patient not taking: Reported on 01/30/2018) 90 tablet 3  . [DISCONTINUED] omeprazole (PRILOSEC) 20 MG capsule TAKE ONE CAPSULE BY MOUTH ONCE DAILY (Patient not taking: Reported on 01/30/2018) 30 capsule 3   Social History   Socioeconomic History  . Marital status: Married    Spouse name: Not on file  . Number of children: Not on file  . Years of education: Not on file  . Highest education level: Not on file  Occupational History  . Not on file  Tobacco Use  . Smoking status: Never Smoker  . Smokeless tobacco: Never Used  Substance and Sexual Activity  . Alcohol  use: Yes    Alcohol/week: 1.0 standard drink    Types: 1 Cans of beer per week    Comment: EVERYOTHER MONTH  . Drug use: No  . Sexual activity: Yes  Other Topics Concern  . Not on file  Social History Narrative  . Not on file   Social Determinants of Health   Financial Resource Strain:   . Difficulty of Paying Living Expenses: Not on file  Food Insecurity:   . Worried About Programme researcher, broadcasting/film/video in the Last Year: Not on file  . Ran Out of Food in the Last Year: Not on file  Transportation Needs:   . Lack of Transportation (Medical): Not on file  . Lack of Transportation (Non-Medical): Not on file  Physical Activity:   . Days of Exercise per Week: Not on file  . Minutes of Exercise per Session: Not on file  Stress:   . Feeling of Stress : Not on file  Social Connections:   . Frequency of Communication with Friends and Family: Not on file  . Frequency of Social Gatherings with Friends and Family: Not on file  . Attends Religious Services: Not on file  . Active Member of Clubs or Organizations: Not on file  . Attends Banker Meetings: Not on file  . Marital Status: Not on file  Intimate Partner Violence:   . Fear of Current or Ex-Partner: Not on file  . Emotionally Abused: Not on file  . Physically  Abused: Not on file  . Sexually Abused: Not on file   Family History  Problem Relation Age of Onset  . Arthritis Mother   . Hyperlipidemia Mother   . Hypertension Mother   . Varicose Veins Mother   . Diabetes Daughter   . Arthritis Maternal Grandmother   . Cancer Maternal Grandmother   . Diabetes Maternal Grandmother   . Varicose Veins Maternal Grandmother   . Arthritis Maternal Grandfather   . Cancer Maternal Grandfather   . Hearing loss Maternal Grandfather     OBJECTIVE:  Vitals:   08/19/20 1729  BP: (S) (!) 165/118  Pulse: (!) 104  Resp: 18  Temp: 98.3 F (36.8 C)  SpO2: 95%   Rechecked blood pressure 154/112  General appearance: alert; appears  fatigued, but nontoxic; speaking in full sentences and tolerating own secretions HEENT: NCAT; Ears: EACs clear, TMs pearly gray; Eyes: PERRL.  EOM grossly intact. Nose: nares patent without rhinorrhea, Throat: oropharynx clear, tonsils non erythematous or enlarged, uvula midline  Neck: supple without LAD Lungs: unlabored respirations, symmetrical air entry; cough: mild; no respiratory distress; CTAB Heart: regular rate and rhythm.  Radial pulses 2+ symmetrical bilaterally Skin: warm and dry Psychological: alert and cooperative; normal mood and affect  ASSESSMENT & PLAN:  1. Encounter for screening for COVID-19   2. Viral URI with cough   3. Suspected COVID-19 virus infection     Meds ordered this encounter  Medications  . benzonatate (TESSALON) 100 MG capsule    Sig: Take 1 capsule (100 mg total) by mouth every 8 (eight) hours.    Dispense:  21 capsule    Refill:  0    Order Specific Question:   Supervising Provider    Answer:   Eustace Moore [[id number]]   COVID testing ordered.  It will take between 5-7 days for test results.  Someone will contact you regarding abnormal results.    In the meantime: You should remain isolated in your home for 10 days from symptom onset AND greater than 72 hours after symptoms resolution (absence of fever without the use of fever-reducing medication and improvement in respiratory symptoms), whichever is longer Get plenty of rest and push fluids Tessalon Perles prescribed for cough Use OTC zyrtec for nasal congestion, runny nose, and/or sore throat Use OTC flonase for nasal congestion and runny nose Use medications daily for symptom relief Use OTC medications like ibuprofen or tylenol as needed fever or pain Call or go to the ED if you have any new or worsening symptoms such as fever, worsening cough, shortness of breath, chest tightness, chest pain, turning blue, changes in mental status, etc...   Reviewed expectations re: course of current  medical issues. Questions answered. Outlined signs and symptoms indicating need for more acute intervention. Patient verbalized understanding. After Visit Summary given.         Rennis Harding, PA-C 08/19/20 1755

## 2020-08-19 NOTE — ED Triage Notes (Signed)
Pt presents with c/o cough and sore throat that began a couple days

## 2020-08-20 LAB — NOVEL CORONAVIRUS, NAA: SARS-CoV-2, NAA: NOT DETECTED

## 2020-08-20 LAB — SARS-COV-2, NAA 2 DAY TAT

## 2021-03-25 ENCOUNTER — Ambulatory Visit (INDEPENDENT_AMBULATORY_CARE_PROVIDER_SITE_OTHER): Payer: 59 | Admitting: Internal Medicine

## 2021-03-25 ENCOUNTER — Other Ambulatory Visit: Payer: Self-pay

## 2021-03-25 ENCOUNTER — Encounter: Payer: Self-pay | Admitting: Internal Medicine

## 2021-03-25 VITALS — BP 170/109 | HR 100 | Temp 98.9°F | Resp 18 | Ht 69.0 in | Wt 277.8 lb

## 2021-03-25 DIAGNOSIS — R351 Nocturia: Secondary | ICD-10-CM | POA: Diagnosis not present

## 2021-03-25 DIAGNOSIS — Z7689 Persons encountering health services in other specified circumstances: Secondary | ICD-10-CM

## 2021-03-25 DIAGNOSIS — G4733 Obstructive sleep apnea (adult) (pediatric): Secondary | ICD-10-CM | POA: Diagnosis not present

## 2021-03-25 DIAGNOSIS — I10 Essential (primary) hypertension: Secondary | ICD-10-CM

## 2021-03-25 LAB — POCT URINALYSIS DIP (CLINITEK)
Bilirubin, UA: NEGATIVE
Blood, UA: NEGATIVE
Glucose, UA: NEGATIVE mg/dL
Ketones, POC UA: NEGATIVE mg/dL
Leukocytes, UA: NEGATIVE
Nitrite, UA: NEGATIVE
POC PROTEIN,UA: 100 — AB
Spec Grav, UA: 1.015 (ref 1.010–1.025)
Urobilinogen, UA: 0.2 E.U./dL
pH, UA: 6.5 (ref 5.0–8.0)

## 2021-03-25 MED ORDER — LOSARTAN POTASSIUM 50 MG PO TABS
50.0000 mg | ORAL_TABLET | Freq: Every day | ORAL | 0 refills | Status: DC
Start: 2021-03-25 — End: 2021-07-16

## 2021-03-25 MED ORDER — METOPROLOL SUCCINATE ER 25 MG PO TB24: 25.0000 mg | ORAL_TABLET | Freq: Every day | ORAL | 0 refills | Status: DC

## 2021-03-25 NOTE — Patient Instructions (Addendum)
Please start taking Losartan and Metoprolol as prescribed.  Please get fasting blood tests done within a week.  Please follow DASH diet and perform moderate exercise/walking at least 150 mins/week.  You are being scheduled for sleep study.   [email or web address]">  DASH Eating Plan DASH stands for Dietary Approaches to Stop Hypertension. The DASH eating plan is a healthy eating plan that has been shown to:  Reduce high blood pressure (hypertension).  Reduce your risk for type 2 diabetes, heart disease, and stroke.  Help with weight loss. What are tips for following this plan? Reading food labels  Check food labels for the amount of salt (sodium) per serving. Choose foods with less than 5 percent of the Daily Value of sodium. Generally, foods with less than 300 milligrams (mg) of sodium per serving fit into this eating plan.  To find whole grains, look for the word "whole" as the first word in the ingredient list. Shopping  Buy products labeled as "low-sodium" or "no salt added."  Buy fresh foods. Avoid canned foods and pre-made or frozen meals. Cooking  Avoid adding salt when cooking. Use salt-free seasonings or herbs instead of table salt or sea salt. Check with your health care provider or pharmacist before using salt substitutes.  Do not fry foods. Cook foods using healthy methods such as baking, boiling, grilling, roasting, and broiling instead.  Cook with heart-healthy oils, such as olive, canola, avocado, soybean, or sunflower oil. Meal planning  Eat a balanced diet that includes: ? 4 or more servings of fruits and 4 or more servings of vegetables each day. Try to fill one-half of your plate with fruits and vegetables. ? 6-8 servings of whole grains each day. ? Less than 6 oz (170 g) of lean meat, poultry, or fish each day. A 3-oz (85-g) serving of meat is about the same size as a deck of cards. One egg equals 1 oz (28  g). ? 2-3 servings of low-fat dairy each day. One serving is 1 cup (237 mL). ? 1 serving of nuts, seeds, or beans 5 times each week. ? 2-3 servings of heart-healthy fats. Healthy fats called omega-3 fatty acids are found in foods such as walnuts, flaxseeds, fortified milks, and eggs. These fats are also found in cold-water fish, such as sardines, salmon, and mackerel.  Limit how much you eat of: ? Canned or prepackaged foods. ? Food that is high in trans fat, such as some fried foods. ? Food that is high in saturated fat, such as fatty meat. ? Desserts and other sweets, sugary drinks, and other foods with added sugar. ? Full-fat dairy products.  Do not salt foods before eating.  Do not eat more than 4 egg yolks a week.  Try to eat at least 2 vegetarian meals a week.  Eat more home-cooked food and less restaurant, buffet, and fast food.   Lifestyle  When eating at a restaurant, ask that your food be prepared with less salt or no salt, if possible.  If you drink alcohol: ? Limit how much you use to:  0-1 drink a day for women who are not pregnant.  0-2 drinks a day for men. ? Be aware of how much alcohol is in your drink. In the [place] one drink equals one 12 oz bottle of beer (355 mL), one 5 oz glass of wine (148 mL), or one 1 oz glass of hard liquor (44 mL). General information  Avoid eating more than 2,300 mg of salt a day.  If you have hypertension, you may need to reduce your sodium intake to 1,500 mg a day.  Work with your health care provider to maintain a healthy body weight or to lose weight. Ask what an ideal weight is for you.  Get at least 30 minutes of exercise that causes your heart to beat faster (aerobic exercise) most days of the week. Activities may include walking, swimming, or biking.  Work with your health care provider or dietitian to adjust your eating plan to your individual calorie needs. What foods should I eat? Fruits All fresh, dried, or frozen fruit.  Canned fruit in natural juice (without added sugar). Vegetables Fresh or frozen vegetables (raw, steamed, roasted, or grilled). Low-sodium or reduced-sodium tomato and vegetable juice. Low-sodium or reduced-sodium tomato sauce and tomato paste. Low-sodium or reduced-sodium canned vegetables. Grains Whole-grain or whole-wheat bread. Whole-grain or whole-wheat pasta. Brown rice. [place]. Bulgur. Whole-grain and low-sodium cereals. Pita bread. Low-fat, low-sodium crackers. Whole-wheat flour tortillas. Meats and other proteins Skinless chicken or [place]. Ground chicken or [place]. Pork with fat trimmed off. Fish and seafood. Egg whites. Dried beans, peas, or lentils. Unsalted nuts, nut butters, and seeds. Unsalted canned beans. Lean cuts of beef with fat trimmed off. Low-sodium, lean precooked or cured meat, such as sausages or meat loaves. Dairy Low-fat (1%) or fat-free (skim) milk. Reduced-fat, low-fat, or fat-free cheeses. Nonfat, low-sodium ricotta or cottage cheese. Low-fat or nonfat yogurt. Low-fat, low-sodium cheese. Fats and oils Soft margarine without trans fats. Vegetable oil. Reduced-fat, low-fat, or light mayonnaise and salad dressings (reduced-sodium). Canola, safflower, olive, avocado, soybean, and sunflower oils. Avocado. Seasonings and condiments Herbs. Spices. Seasoning mixes without salt. Other foods Unsalted popcorn and pretzels. Fat-free sweets. The items listed above may not be a complete list of foods and beverages you can eat. Contact a dietitian for more information. What foods should I avoid? Fruits Canned fruit in a light or heavy syrup. Fried fruit. Fruit in cream or butter sauce. Vegetables Creamed or fried vegetables. Vegetables in a cheese sauce. Regular canned vegetables (not low-sodium or reduced-sodium). Regular canned tomato sauce and paste (not low-sodium or reduced-sodium). Regular tomato and vegetable juice (not low-sodium or reduced-sodium). Angie Fava.  Olives. Grains Baked goods made with fat, such as croissants, muffins, or some breads. Dry pasta or rice meal packs. Meats and other proteins Fatty cuts of meat. Ribs. Fried meat. Berniece Salines. Bologna, salami, and other precooked or cured meats, such as sausages or meat loaves. Fat from the back of a pig (fatback). Bratwurst. Salted nuts and seeds. Canned beans with added salt. Canned or smoked fish. Whole eggs or egg yolks. Chicken or [place] with skin. Dairy Whole or 2% milk, cream, and half-and-half. Whole or full-fat cream cheese. Whole-fat or sweetened yogurt. Full-fat cheese. Nondairy creamers. Whipped toppings. Processed cheese and cheese spreads. Fats and oils Butter. Stick margarine. Lard. Shortening. Ghee. Bacon fat. Tropical oils, such as coconut, palm kernel, or palm oil. Seasonings and condiments Onion salt, garlic salt, seasoned salt, table salt, and sea salt. Worcestershire sauce. Tartar sauce. Barbecue sauce. Teriyaki sauce. Soy sauce, including reduced-sodium. Steak sauce. Canned and packaged gravies. Fish sauce. Oyster sauce. Cocktail sauce. Store-bought horseradish. Ketchup. Mustard. Meat flavorings and tenderizers. Bouillon cubes. Hot sauces. Pre-made or packaged marinades. Pre-made or packaged taco seasonings. Relishes. Regular salad dressings. Other foods Salted popcorn and pretzels. The items listed above may not be a complete list of foods and beverages you should avoid. Contact a dietitian for more information. Where to find more information  National Heart, Lung, and Blood Institute: https://wilson-eaton.com/  American Heart Association: www.heart.org  Academy of Nutrition and Dietetics: www.eatright.[place]: www.kidney.org Summary  The DASH eating plan is a healthy eating plan that has been shown to reduce high blood pressure (hypertension). It may also reduce your risk for type 2 diabetes, heart disease, and stroke.  When on the DASH eating plan, aim to  eat more fresh fruits and vegetables, whole grains, lean proteins, low-fat dairy, and heart-healthy fats.  With the DASH eating plan, you should limit salt (sodium) intake to 2,300 mg a day. If you have hypertension, you may need to reduce your sodium intake to 1,500 mg a day.  Work with your health care provider or dietitian to adjust your eating plan to your individual calorie needs. This information is not intended to replace advice given to you by your health care provider. Make sure you discuss any questions you have with your health care provider. Document Revised: 09/29/2019 Document Reviewed: 09/29/2019 Elsevier Patient Education  2021 [employer].
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
## 2021-03-25 NOTE — Assessment & Plan Note (Signed)
Diet modification and moderate exercise/walking at least 150 mins/week DASH diet material provided 

## 2021-03-25 NOTE — Assessment & Plan Note (Signed)
Snoring and uncontrolled HTN in association with morbid obesity Will obtain home sleep study

## 2021-03-25 NOTE — Assessment & Plan Note (Signed)
Unclear etiology UA unremarkable Will check CMP and HbA1C for DM screening If persistent frequency symptoms, will refer to Urology

## 2021-03-25 NOTE — Assessment & Plan Note (Signed)
Care established Previous chart reviewed History and medications reviewed with the patient 

## 2021-03-25 NOTE — Assessment & Plan Note (Signed)
BP Readings from Last 1 Encounters:  03/25/21 (!) 170/109   uncontrolled due to noncompliance Restarted Losartan and Metoprolol Counseled for compliance with the medications Advised DASH diet and moderate exercise/walking, at least 150 mins/week

## 2021-04-15 ENCOUNTER — Ambulatory Visit
Admission: RE | Admit: 2021-04-15 | Discharge: 2021-04-15 | Disposition: A | Discharge status: Home / Self Care | Payer: 59 | Source: Ambulatory Visit | Attending: Internal Medicine | Admitting: Internal Medicine

## 2021-04-15 ENCOUNTER — Other Ambulatory Visit: Payer: Self-pay

## 2021-04-15 VITALS — BP 150/104 | HR 111 | Temp 98.3°F | Resp 18

## 2021-04-15 DIAGNOSIS — J209 Acute bronchitis, unspecified: Secondary | ICD-10-CM

## 2021-04-15 DIAGNOSIS — H6691 Otitis media, unspecified, right ear: Secondary | ICD-10-CM

## 2021-04-15 MED ORDER — ALBUTEROL SULFATE HFA 108 (90 BASE) MCG/ACT IN AERS: 2.0000 | INHALATION_SPRAY | RESPIRATORY_TRACT | 0 refills | Status: AC | PRN

## 2021-04-15 MED ORDER — AMOXICILLIN-POT CLAVULANATE 875-125 MG PO TABS: 1.0000 | ORAL_TABLET | Freq: Two times a day (BID) | ORAL | 0 refills | Status: DC

## 2021-04-15 MED ORDER — HYDROCOD POLST-CPM POLST ER 10-8 MG/5ML PO SUER: 5.0000 mL | Freq: Every evening | ORAL | 0 refills | Status: DC | PRN

## 2021-04-15 NOTE — ED Provider Notes (Signed)
[facility]    CSN: [id number] Arrival date & time: 04/15/21  1650      History   Chief Complaint No chief complaint on file.   HPI Adam Crosby is a 45 y.o. male who presents with persistent cough x 2 weeks. Started with URI. Cough is worse if he gets hot or lays down. Has been wheezing. Denies a fever.     Past Medical History:  Diagnosis Date  . Allergy   . Arthritis   . GERD (gastroesophageal reflux disease) 10/09/2016  . Hypertension   . Lumbar vertebral fracture (HCC) 01/30/2018  . OSA (obstructive sleep apnea) 10/22/2016    Patient Active Problem List   Diagnosis Date Noted  . Encounter to establish care 03/25/2021  . Nocturia 03/25/2021  . Compression fracture of L1 lumbar vertebra (HCC) 01/31/2018  . Vitamin D deficiency 10/26/2016  . Morbid obesity (HCC) 10/22/2016  . OSA (obstructive sleep apnea) 10/22/2016  . GERD (gastroesophageal reflux disease) 10/09/2016  . Essential hypertension 09/23/2016    History reviewed. No pertinent surgical history.     Home Medications    Prior to Admission medications   Medication Sig Start Date End Date Taking? Authorizing Provider  albuterol (VENTOLIN HFA) 108 (90 Base) MCG/ACT inhaler Inhale 2 puffs into the lungs every 4 (four) hours as needed for wheezing or shortness of breath. 04/15/21  Yes Rodriguez-Southworth, Nettie Elm, PA-C  amoxicillin-clavulanate (AUGMENTIN) 875-125 MG tablet Take 1 tablet by mouth 2 (two) times daily. 04/15/21  Yes Rodriguez-Southworth, Nettie Elm, PA-C  chlorpheniramine-HYDROcodone (TUSSIONEX PENNKINETIC ER) 10-8 MG/5ML SUER Take 5 mLs by mouth at bedtime as needed for cough. 04/15/21  Yes Rodriguez-Southworth, Nettie Elm, PA-C  losartan (COZAAR) 50 MG tablet Take 1 tablet (50 mg total) by mouth daily. 03/25/21   Anabel Halon, MD  metoprolol succinate (TOPROL-XL) 25 MG 24 hr tablet Take 1 tablet (25 mg total) by mouth daily. 03/25/21   Anabel Halon, MD  hydrochlorothiazide (HYDRODIURIL)  25 MG tablet Take 1 tablet (25 mg total) by mouth daily. Patient not taking: Reported on 01/30/2018 09/23/16 05/19/20  Allayne Butcher B, PA-C  omeprazole (PRILOSEC) 20 MG capsule TAKE ONE CAPSULE BY MOUTH ONCE DAILY Patient not taking: Reported on 01/30/2018 02/25/17 05/19/20  Salley Scarlet, MD    Family History Family History  Problem Relation Age of Onset  . Arthritis Mother   . Hyperlipidemia Mother   . Hypertension Mother   . Varicose Veins Mother   . Diabetes Daughter   . Arthritis Maternal Grandmother   . Cancer Maternal Grandmother   . Diabetes Maternal Grandmother   . Varicose Veins Maternal Grandmother   . Arthritis Maternal Grandfather   . Cancer Maternal Grandfather   . Hearing loss Maternal Grandfather     Social History Social History   Tobacco Use  . Smoking status: Never Smoker  . Smokeless tobacco: Never Used  Substance Use Topics  . Alcohol use: Yes    Alcohol/week: 1.0 standard drink    Types: 1 Cans of beer per week    Comment: EVERYOTHER MONTH  . Drug use: No     Allergies   Patient has no known allergies.   Review of Systems Review of Systems  Constitutional: Positive for diaphoresis.       Has had cold and hot sensation   Respiratory: Positive for cough and shortness of breath. Negative for wheezing.      Physical Exam Triage Vital Signs ED Triage Vitals  Enc Vitals Group  BP 04/15/21 1716 (S) (!) 150/104     Pulse Rate 04/15/21 1716 (!) 111     Resp 04/15/21 1716 18     Temp 04/15/21 1716 98.3 F (36.8 C)     Temp Source 04/15/21 1716 Oral     SpO2 04/15/21 1716 92 %     Weight --      Height --      Head Circumference --      Peak Flow --      Pain Score 04/15/21 1717 6     Pain Loc --      Pain Edu? --      Excl. in GC? --    No data found.  Updated Vital Signs BP (S) (!) 150/104 (BP Location: Right Arm) Comment: states he has not taken his BP meds x 2 days  Pulse (!) 111   Temp 98.3 F (36.8 C) (Oral)   Resp 18    SpO2 92%   Visual Acuity Right Eye Distance:   Left Eye Distance:   Bilateral Distance:    Right Eye Near:   Left Eye Near:    Bilateral Near:     Physical Exam Repeated pulse ox without mask was 96% Physical Exam Constitutional:      General: He is not in acute distress.    Appearance: He is not toxic-appearing.  HENT:     Head: Normocephalic.     Right Ear: Tympanic Red and flat  ear canal and external ear normal.     Left Ear: Ear canal and external ear normal.     Nose: Nose normal.     Mouth/Throat:     Mouth: Mucous membranes are moist.     Pharynx: Oropharynx is clear.  Eyes:     General: No scleral icterus.    Conjunctiva/sclera: Conjunctivae normal.  Cardiovascular:     Rate and Rhythm: Normal rate and regular rhythm.     Heart sounds: No murmur heard.   Pulmonary:     Effort: Pulmonary effort is normal. No respiratory distress.     Breath sounds: Wheezing present RUL.     Comments: Has auditory wheezing Musculoskeletal:        General: Normal range of motion.     Cervical back: Neck supple.  Lymphadenopathy:     Cervical: No cervical adenopathy.  Skin:    General: Skin is warm and dry.     Findings: No rash.  Neurological:     Mental Status: He is alert and oriented to person, place, and time.     Gait: Gait normal.  Psychiatric:        Mood and Affect: Mood normal.        Behavior: Behavior normal.        Thought Content: Thought content normal.        Judgment: Judgment normal.    UC Treatments / Results  Labs (all labs ordered are listed, but only abnormal results are displayed) Labs Reviewed - No data to display  EKG   Radiology No results found.  Procedures Procedures (including critical care time)  Medications Ordered in UC Medications - No data to display  Initial Impression / Assessment and Plan / UC Course  I have reviewed the triage vital signs and the nursing notes. Bronchitis and R OM. I placed him on Augmentin, Albuterol  inhaler and Tussionex as noted.  Final Clinical Impressions(s) / UC Diagnoses   Final diagnoses:  Acute bronchitis, unspecified organism  Acute otitis media, right   Discharge Instructions   None    ED Prescriptions    Medication Sig Dispense Auth. Provider   amoxicillin-clavulanate (AUGMENTIN) 875-125 MG tablet Take 1 tablet by mouth 2 (two) times daily. 20 tablet Rodriguez-Southworth, Nettie Elm, PA-C   albuterol (VENTOLIN HFA) 108 (90 Base) MCG/ACT inhaler Inhale 2 puffs into the lungs every 4 (four) hours as needed for wheezing or shortness of breath. 18 g Rodriguez-Southworth, Randall Colden, PA-C   chlorpheniramine-HYDROcodone (TUSSIONEX PENNKINETIC ER) 10-8 MG/5ML SUER Take 5 mLs by mouth at bedtime as needed for cough. 115 mL Rodriguez-Southworth, Nettie Elm, PA-C     PDMP not reviewed this encounter.   Garey Ham, Cordelia Poche 04/16/21 2112

## 2021-04-15 NOTE — ED Triage Notes (Signed)
Cough x 2 weeks.  Coughing up yellow sputum.  Nasal congestion.

## 2021-05-06 ENCOUNTER — Ambulatory Visit: Payer: 59 | Admitting: Internal Medicine

## 2021-05-13 ENCOUNTER — Ambulatory Visit: Payer: 59 | Admitting: Internal Medicine

## 2021-05-20 ENCOUNTER — Ambulatory Visit: Payer: 59 | Admitting: Internal Medicine

## 2021-06-10 ENCOUNTER — Ambulatory Visit: Payer: 59 | Admitting: Internal Medicine

## 2021-06-10 ENCOUNTER — Telehealth: Payer: Self-pay

## 2021-06-10 NOTE — Telephone Encounter (Signed)
Pt missed appt scheduled today please reschedule this asap to discuss with patel

## 2021-06-10 NOTE — Telephone Encounter (Signed)
Appointment scheduled.

## 2021-06-10 NOTE — Telephone Encounter (Signed)
Patient called asking how is he suppose to do the sleep study at night that Dr Allena Katz ordered when he gets up and down all night to go to the bathroom. Call back # [phone].

## 2021-06-16 ENCOUNTER — Other Ambulatory Visit: Payer: Self-pay

## 2021-06-16 ENCOUNTER — Telehealth (INDEPENDENT_AMBULATORY_CARE_PROVIDER_SITE_OTHER): Payer: 59 | Admitting: Internal Medicine

## 2021-06-16 ENCOUNTER — Encounter: Payer: Self-pay | Admitting: Internal Medicine

## 2021-06-16 DIAGNOSIS — U071 COVID-19: Secondary | ICD-10-CM | POA: Diagnosis not present

## 2021-06-16 MED ORDER — NIRMATRELVIR/RITONAVIR (PAXLOVID)TABLET: 3.0000 | ORAL_TABLET | Freq: Two times a day (BID) | ORAL | 0 refills | Status: AC

## 2021-06-16 NOTE — Progress Notes (Signed)
Virtual Visit via Telephone Note   This visit type was conducted due to national recommendations for restrictions regarding the COVID-19 Pandemic (e.g. social distancing) in an effort to limit this patient's exposure and mitigate transmission in our community.  Due to his co-morbid illnesses, this patient is at least at moderate risk for complications without adequate follow up.  This format is felt to be most appropriate for this patient at this time.  The patient did not have access to video technology/had technical difficulties with video requiring transitioning to audio format only (telephone).  All issues noted in this document were discussed and addressed.  No physical exam could be performed with this format.  Evaluation Performed:  Follow-up visit  Date:  06/16/2021   ID:  Adam Crosby, DOB [birth year], MRN [id number]  Patient Location: Home Provider Location: Office/Clinic  Participants: Patient Location of Patient: Home Location of Provider: Telehealth Consent was obtain for visit to be over via telehealth. I verified that I am speaking with the correct person using two identifiers.  PCP:  Anabel Halon, MD   Chief Complaint:  Cough, chills and diarrhea  History of Present Illness:    Adam Crosby is a 45 y.o. male who has a televisit for c/o cough, chills, fatigue, vomiting and diarrhea for last 3 days. Denies any dyspnea. He tested positive for COVID yesterday. He has tried Nyquil with minimal relief with cough.  The patient does have symptoms concerning for COVID-19 infection (fever, chills, cough, or new shortness of breath).   Past Medical, Surgical, Social History, Allergies, and Medications have been Reviewed.  Past Medical History:  Diagnosis Date   Allergy    Arthritis    GERD (gastroesophageal reflux disease) 10/09/2016   Hypertension    Lumbar vertebral fracture (HCC) 01/30/2018   OSA (obstructive sleep apnea) 10/22/2016   History reviewed. No  pertinent surgical history.   Current Meds  Medication Sig   albuterol (VENTOLIN HFA) 108 (90 Base) MCG/ACT inhaler Inhale 2 puffs into the lungs every 4 (four) hours as needed for wheezing or shortness of breath.   losartan (COZAAR) 50 MG tablet Take 1 tablet (50 mg total) by mouth daily.   metoprolol succinate (TOPROL-XL) 25 MG 24 hr tablet Take 1 tablet (25 mg total) by mouth daily.     Allergies:   Patient has no known allergies.   ROS:   Please see the history of present illness.     All other systems reviewed and are negative.   Labs/Other Tests and Data Reviewed:    Recent Labs: No results found for requested labs within last 8760 hours.   Recent Lipid Panel Lab Results  Component Value Date/Time   CHOL 165 10/23/2016 08:38 AM   TRIG 123 10/23/2016 08:38 AM   HDL 37 (L) 10/23/2016 08:38 AM   CHOLHDL 4.5 10/23/2016 08:38 AM   LDLCALC 103 (H) 10/23/2016 08:38 AM    Wt Readings from Last 3 Encounters:  03/25/21 277 lb 12.8 oz (126 kg)  05/19/20 264 lb (119.7 kg)  01/30/18 275 lb 5.7 oz (124.9 kg)      ASSESSMENT & PLAN:    COVID-19 infection Started Paxlovid Mucinex or Robitussin PRN for cough Advised to maintain at least 64 ounces of fluid in a day, more if has vomiting or diarrhea  Time:   Today, I have spent 9 minutes reviewing the chart, including problem list, medications, and with the patient with telehealth technology discussing the above problems.  Medication Adjustments/Labs and Tests Ordered: Current medicines are reviewed at length with the patient today.  Concerns regarding medicines are outlined above.   Tests Ordered: No orders of the defined types were placed in this encounter.   Medication Changes: No orders of the defined types were placed in this encounter.    Note: This dictation was prepared with Dragon dictation along with smaller phrase technology. Similar sounding words can be transcribed inadequately or may not be corrected  upon review. Any transcriptional errors that result from this process are unintentional.      Disposition:  Follow up  Signed, Anabel Halon, MD  06/16/2021 3:50 PM     [facility]

## 2021-06-20 ENCOUNTER — Telehealth: Payer: Self-pay

## 2021-06-20 NOTE — Telephone Encounter (Signed)
Patient calling he states he did a phone visit on 06/16/21 for covid + he is requesting a note for work he is due back on 06/24/21 his work is requesting a note stating he is out with covid ph# [phone]

## 2021-06-23 ENCOUNTER — Encounter: Payer: Self-pay | Admitting: *Deleted

## 2021-06-23 NOTE — Telephone Encounter (Signed)
Pt advised letter is provided and copy is in chart

## 2021-07-09 ENCOUNTER — Ambulatory Visit: Payer: 59 | Admitting: Internal Medicine

## 2021-07-16 ENCOUNTER — Ambulatory Visit (INDEPENDENT_AMBULATORY_CARE_PROVIDER_SITE_OTHER): Payer: 59 | Admitting: Internal Medicine

## 2021-07-16 ENCOUNTER — Other Ambulatory Visit: Payer: Self-pay

## 2021-07-16 ENCOUNTER — Encounter: Payer: Self-pay | Admitting: Internal Medicine

## 2021-07-16 VITALS — BP 155/102 | HR 91 | Temp 98.8°F | Resp 18 | Ht 69.0 in | Wt 280.0 lb

## 2021-07-16 DIAGNOSIS — G4733 Obstructive sleep apnea (adult) (pediatric): Secondary | ICD-10-CM

## 2021-07-16 DIAGNOSIS — R351 Nocturia: Secondary | ICD-10-CM | POA: Diagnosis not present

## 2021-07-16 DIAGNOSIS — I10 Essential (primary) hypertension: Secondary | ICD-10-CM | POA: Diagnosis not present

## 2021-07-16 MED ORDER — LOSARTAN POTASSIUM 100 MG PO TABS: 100.0000 mg | ORAL_TABLET | Freq: Every day | ORAL | 2 refills | Status: DC

## 2021-07-16 MED ORDER — METOPROLOL SUCCINATE ER 25 MG PO TB24: 25.0000 mg | ORAL_TABLET | Freq: Every day | ORAL | 0 refills | Status: DC

## 2021-07-16 MED ORDER — LOSARTAN POTASSIUM 100 MG PO TABS: 50.0000 mg | ORAL_TABLET | Freq: Every day | ORAL | 2 refills | Status: DC

## 2021-07-16 NOTE — Assessment & Plan Note (Signed)
Unclear etiology UA unremarkable Check CMP and HbA1C for DM screening Could be a component of stress/anxiety related to OSA induced sleep interruptions as symptoms improve when he sleeps in a recliner chair If persistent frequency symptoms, will refer to Urology

## 2021-07-16 NOTE — Patient Instructions (Signed)
Please start taking Losartan 100 mg once daily.  Please continue to follow DASH diet and perform moderate exercise/walking at least 150 mins/week.  Please get home sleep test done as discussed.

## 2021-07-16 NOTE — Progress Notes (Signed)
Established Patient Office Visit  Subjective:  Patient ID: Adam Crosby, male    DOB: 15-Jun-1976  Age: 45 y.o. MRN: [id number]  CC:  Chief Complaint  Patient presents with   Follow-up    6 week follow up HTN and nocturia discuss sleep study had covid a few weeks back but is feeling better still sob he did have labs drawn today     HPI Adam Crosby is a 45 year old male with PMH of HTN and morbid obesity who presents for follow up of his chronic medical conditions.  HTN: BP is uncontrolled. Takes medications regularly. Patient denies headache, dizziness, chest pain, dyspnea or palpitations.  He has not had sleep study yet. He states that he was told that he had to get home sleep test arranged again.  He continues to have nocturia, especially when he tries to lie down. He states that he does not urinary frequency when he sleeps in a recliner. Denies any dysuria or hematuria.  Past Medical History:  Diagnosis Date   Allergy    Arthritis    GERD (gastroesophageal reflux disease) 10/09/2016   Hypertension    Lumbar vertebral fracture (HCC) 01/30/2018   OSA (obstructive sleep apnea) 10/22/2016    History reviewed. No pertinent surgical history.  Family History  Problem Relation Age of Onset   Arthritis Mother    Hyperlipidemia Mother    Hypertension Mother    Varicose Veins Mother    Diabetes Daughter    Arthritis Maternal Grandmother    Cancer Maternal Grandmother    Diabetes Maternal Grandmother    Varicose Veins Maternal Grandmother    Arthritis Maternal Grandfather    Cancer Maternal Grandfather    Hearing loss Maternal Grandfather     Social History   Socioeconomic History   Marital status: Married    Spouse name: Not on file   Number of children: Not on file   Years of education: Not on file   Highest education level: Not on file  Occupational History   Not on file  Tobacco Use   Smoking status: Never   Smokeless tobacco: Never  Substance and  Sexual Activity   Alcohol use: Yes    Alcohol/week: 1.0 standard drink    Types: 1 Cans of beer per week    Comment: EVERYOTHER MONTH   Drug use: No   Sexual activity: Yes  Other Topics Concern   Not on file  Social History Narrative   Not on file   Social Determinants of Health   Financial Resource Strain: Not on file  Food Insecurity: Not on file  Transportation Needs: Not on file  Physical Activity: Not on file  Stress: Not on file  Social Connections: Not on file  Intimate Partner Violence: Not on file    Outpatient Medications Prior to Visit  Medication Sig Dispense Refill   albuterol (VENTOLIN HFA) 108 (90 Base) MCG/ACT inhaler Inhale 2 puffs into the lungs every 4 (four) hours as needed for wheezing or shortness of breath. 18 g 0   losartan (COZAAR) 50 MG tablet Take 1 tablet (50 mg total) by mouth daily. 90 tablet 0   metoprolol succinate (TOPROL-XL) 25 MG 24 hr tablet Take 1 tablet (25 mg total) by mouth daily. 90 tablet 0   No facility-administered medications prior to visit.    No Known Allergies  ROS Review of Systems  Constitutional:  Negative for chills and fever.  HENT:  Negative for congestion and sore throat.  Eyes:  Negative for pain and discharge.  Respiratory:  Negative for cough and shortness of breath.   Cardiovascular:  Negative for chest pain and palpitations.  Gastrointestinal:  Negative for constipation, diarrhea, nausea and vomiting.  Endocrine: Negative for polydipsia and polyuria.  Genitourinary:  Positive for frequency. Negative for dysuria and hematuria.  Musculoskeletal:  Negative for neck pain and neck stiffness.  Skin:  Negative for rash.  Neurological:  Negative for dizziness, weakness, numbness and headaches.  Psychiatric/Behavioral:  Negative for agitation and behavioral problems.      Objective:    Physical Exam Vitals reviewed.  Constitutional:      General: He is not in acute distress.    Appearance: He is obese. He is  not diaphoretic.  HENT:     Head: Normocephalic and atraumatic.     Nose: Nose normal.     Mouth/Throat:     Mouth: Mucous membranes are moist.  Eyes:     General: No scleral icterus.    Extraocular Movements: Extraocular movements intact.  Cardiovascular:     Rate and Rhythm: Normal rate and regular rhythm.     Pulses: Normal pulses.     Heart sounds: Normal heart sounds. No murmur heard. Pulmonary:     Breath sounds: Normal breath sounds. No wheezing or rales.  Abdominal:     Palpations: Abdomen is soft.     Tenderness: There is no abdominal tenderness. There is no right CVA tenderness or left CVA tenderness.  Musculoskeletal:     Cervical back: Neck supple. No tenderness.     Right lower leg: No edema.     Left lower leg: No edema.  Skin:    General: Skin is warm.     Findings: No rash.  Neurological:     General: No focal deficit present.     Mental Status: He is alert and oriented to person, place, and time.  Psychiatric:        Mood and Affect: Mood normal.        Behavior: Behavior normal.    BP (!) 155/102 (BP Location: Left Arm, Patient Position: Sitting, Cuff Size: Normal)   Pulse 91   Temp 98.8 F (37.1 C) (Oral)   Resp 18   Ht 5\' 9"  (1.753 m)   Wt 280 lb (127 kg)   SpO2 96%   BMI 41.35 kg/m  Wt Readings from Last 3 Encounters:  07/16/21 280 lb (127 kg)  03/25/21 277 lb 12.8 oz (126 kg)  05/19/20 264 lb (119.7 kg)     Health Maintenance Due  Topic Date Due   Hepatitis C Screening  Never done   TETANUS/TDAP  12/23/2019   COVID-19 Vaccine (3 - Booster for Pfizer series) 05/14/2021   INFLUENZA VACCINE  Never done    There are no preventive care reminders to display for this patient.  Lab Results  Component Value Date   TSH 1.21 10/23/2016   Lab Results  Component Value Date   WBC 6.7 05/19/2020   HGB 16.7 05/19/2020   HCT 53.6 (H) 05/19/2020   MCV 87.7 05/19/2020   PLT 331 05/19/2020   Lab Results  Component Value Date   NA 141  05/19/2020   K 2.9 (L) 05/19/2020   CO2 32 05/19/2020   GLUCOSE 146 (H) 05/19/2020   BUN 14 05/19/2020   CREATININE 1.29 (H) 05/19/2020   BILITOT 1.0 06/29/2017   ALKPHOS 51 06/29/2017   AST 36 06/29/2017   ALT 62 06/29/2017  PROT 8.0 06/29/2017   ALBUMIN 3.8 06/29/2017   CALCIUM 8.7 (L) 05/19/2020   ANIONGAP 9 05/19/2020   Lab Results  Component Value Date   CHOL 165 10/23/2016   Lab Results  Component Value Date   HDL 37 (L) 10/23/2016   Lab Results  Component Value Date   LDLCALC 103 (H) 10/23/2016   Lab Results  Component Value Date   TRIG 123 10/23/2016   Lab Results  Component Value Date   CHOLHDL 4.5 10/23/2016   No results found for: HGBA1C    Assessment & Plan:   Problem List Items Addressed This Visit       Cardiovascular and Mediastinum   Essential hypertension - Primary    BP Readings from Last 1 Encounters:  07/16/21 (!) 155/102  Uncontrolled with Losartan and Metoprolol Increased Losartan to 100 mg QD Likely a component of OSA as well Counseled for compliance with the medications Advised DASH diet and moderate exercise/walking, at least 150 mins/week      Relevant Medications   losartan (COZAAR) 100 MG tablet   metoprolol succinate (TOPROL-XL) 25 MG 24 hr tablet     Respiratory   OSA (obstructive sleep apnea)    Snoring and uncontrolled HTN in association with morbid obesity Ordered home sleep study again      Relevant Orders   Home sleep test     Other   Nocturia    Unclear etiology UA unremarkable Check CMP and HbA1C for DM screening Could be a component of stress/anxiety related to OSA induced sleep interruptions as symptoms improve when he sleeps in a recliner chair If persistent frequency symptoms, will refer to Urology       Meds ordered this encounter  Medications   DISCONTD: losartan (COZAAR) 100 MG tablet    Sig: Take 0.5 tablets (50 mg total) by mouth daily.    Dispense:  30 tablet    Refill:  2   losartan  (COZAAR) 100 MG tablet    Sig: Take 1 tablet (100 mg total) by mouth daily.    Dispense:  30 tablet    Refill:  2    Please cancel the previous prescription with 50 mg dose.   metoprolol succinate (TOPROL-XL) 25 MG 24 hr tablet    Sig: Take 1 tablet (25 mg total) by mouth daily.    Dispense:  90 tablet    Refill:  0    Follow-up: Return in about 3 months (around 10/15/2021) for HTN and OSA.    Anabel Halon, MD

## 2021-07-16 NOTE — Assessment & Plan Note (Signed)
BP Readings from Last 1 Encounters:  07/16/21 (!) 155/102   Uncontrolled with Losartan and Metoprolol Increased Losartan to 100 mg QD Likely a component of OSA as well Counseled for compliance with the medications Advised DASH diet and moderate exercise/walking, at least 150 mins/week

## 2021-07-16 NOTE — Assessment & Plan Note (Signed)
Snoring and uncontrolled HTN in association with morbid obesity Ordered home sleep study again

## 2021-07-17 LAB — CMP14+EGFR
ALT: 20 IU/L (ref 0–44)
AST: 15 IU/L (ref 0–40)
Albumin/Globulin Ratio: 1.4 (ref 1.2–2.2)
Albumin: 4.2 g/dL (ref 4.0–5.0)
Alkaline Phosphatase: 69 IU/L (ref 44–121)
BUN/Creatinine Ratio: 9 (ref 9–20)
BUN: 11 mg/dL (ref 6–24)
Bilirubin Total: 0.8 mg/dL (ref 0.0–1.2)
CO2: 28 mmol/L (ref 20–29)
Calcium: 9 mg/dL (ref 8.7–10.2)
Chloride: 98 mmol/L (ref 96–106)
Creatinine, Ser: 1.25 mg/dL (ref 0.76–1.27)
Globulin, Total: 3.1 g/dL (ref 1.5–4.5)
Glucose: 94 mg/dL (ref 65–99)
Potassium: 3.9 mmol/L (ref 3.5–5.2)
Sodium: 141 mmol/L (ref 134–144)
Total Protein: 7.3 g/dL (ref 6.0–8.5)
eGFR: 73 mL/min/{1.73_m2} (ref >59)

## 2021-07-17 LAB — CBC
Hematocrit: 48.8 % (ref 37.5–51.0)
Hemoglobin: 16.6 g/dL (ref 13.0–17.7)
MCH: 27.9 pg (ref 26.6–33.0)
MCHC: 34 g/dL (ref 31.5–35.7)
MCV: 82 fL (ref 79–97)
Platelets: 294 10*3/uL (ref 150–450)
RBC: 5.95 x10E6/uL — ABNORMAL HIGH (ref 4.14–5.80)
RDW: 13.3 % (ref 11.6–15.4)
WBC: 5.5 10*3/uL (ref 3.4–10.8)

## 2021-07-17 LAB — LIPID PANEL
Chol/HDL Ratio: 3.9 ratio (ref 0.0–5.0)
Cholesterol, Total: 161 mg/dL (ref 100–199)
HDL: 41 mg/dL (ref >39)
LDL Chol Calc (NIH): 98 mg/dL (ref 0–99)
Triglycerides: 122 mg/dL (ref 0–149)
VLDL Cholesterol Cal: 22 mg/dL (ref 5–40)

## 2021-07-17 LAB — HEMOGLOBIN A1C
Est. average glucose Bld gHb Est-mCnc: 120 mg/dL
Hgb A1c MFr Bld: 5.8 % — ABNORMAL HIGH (ref 4.8–5.6)

## 2021-07-17 LAB — TSH: TSH: 1.35 u[IU]/mL (ref 0.450–4.500)

## 2021-07-17 LAB — PSA: Prostate Specific Ag, Serum: 0.7 ng/mL (ref 0.0–4.0)

## 2021-07-18 LAB — HEPATITIS C ANTIBODY: Hep C Virus Ab: <0.1 s/co ratio (ref 0.0–0.9)

## 2021-07-18 LAB — SPECIMEN STATUS REPORT

## 2021-07-29 ENCOUNTER — Telehealth: Payer: Self-pay | Admitting: *Deleted

## 2021-07-29 NOTE — Telephone Encounter (Signed)
LVM for pt to call the office [facility] is trying to contact him regarding home sleep test he can reach them at [phone]

## 2021-10-11 ENCOUNTER — Other Ambulatory Visit: Payer: Self-pay | Admitting: Internal Medicine

## 2021-10-11 DIAGNOSIS — I10 Essential (primary) hypertension: Secondary | ICD-10-CM

## 2021-10-15 ENCOUNTER — Ambulatory Visit: Payer: 59 | Admitting: Internal Medicine

## 2021-11-06 ENCOUNTER — Ambulatory Visit: Payer: 59 | Admitting: Internal Medicine

## 2021-11-10 ENCOUNTER — Other Ambulatory Visit: Payer: Self-pay | Admitting: Internal Medicine

## 2021-11-10 DIAGNOSIS — I10 Essential (primary) hypertension: Secondary | ICD-10-CM

## 2022-03-25 ENCOUNTER — Other Ambulatory Visit: Payer: Self-pay | Admitting: *Deleted

## 2022-03-25 DIAGNOSIS — I10 Essential (primary) hypertension: Secondary | ICD-10-CM

## 2022-03-25 MED ORDER — LOSARTAN POTASSIUM 100 MG PO TABS: 100.0000 mg | ORAL_TABLET | Freq: Every day | ORAL | 3 refills | Status: DC

## 2022-05-01 ENCOUNTER — Encounter (HOSPITAL_COMMUNITY): Payer: Self-pay | Admitting: Emergency Medicine

## 2022-05-01 ENCOUNTER — Emergency Department (HOSPITAL_COMMUNITY)
Admission: EM | Admit: 2022-05-01 | Discharge: 2022-05-01 | Disposition: A | Discharge status: Home / Self Care | Payer: BC Managed Care – PPO | Attending: Emergency Medicine | Admitting: Emergency Medicine

## 2022-05-01 DIAGNOSIS — I10 Essential (primary) hypertension: Secondary | ICD-10-CM | POA: Insufficient documentation

## 2022-05-01 DIAGNOSIS — M545 Low back pain, unspecified: Secondary | ICD-10-CM | POA: Insufficient documentation

## 2022-05-01 DIAGNOSIS — Y990 Civilian activity done for income or pay: Secondary | ICD-10-CM | POA: Diagnosis not present

## 2022-05-01 DIAGNOSIS — X500XXA Overexertion from strenuous movement or load, initial encounter: Secondary | ICD-10-CM | POA: Insufficient documentation

## 2022-05-01 DIAGNOSIS — M5459 Other low back pain: Secondary | ICD-10-CM | POA: Diagnosis not present

## 2022-05-01 MED ORDER — METHOCARBAMOL 500 MG PO TABS: 500.0000 mg | ORAL_TABLET | Freq: Three times a day (TID) | ORAL | 0 refills | Status: DC | PRN

## 2022-05-01 MED ORDER — LIDOCAINE 5 % EX PTCH: 1.0000 | MEDICATED_PATCH | CUTANEOUS | 0 refills | Status: DC

## 2022-05-01 MED ORDER — PREDNISONE 20 MG PO TABS: 40.0000 mg | ORAL_TABLET | Freq: Every day | ORAL | 0 refills | Status: AC

## 2022-05-01 MED ORDER — PREDNISONE 20 MG PO TABS: 40.0000 mg | ORAL_TABLET | Freq: Every day | ORAL | 0 refills | Status: DC

## 2022-05-01 MED ORDER — PREDNISONE 50 MG PO TABS
60.0000 mg | ORAL_TABLET | Freq: Once | ORAL | Status: AC
  Administered 2022-05-01: 60 mg via ORAL
  Filled 2022-05-01: qty 1

## 2022-05-01 NOTE — ED Provider Notes (Signed)
[facility]-EMERGENCY DEPT [facility] Emergency Department Provider Note MRN:  [id number]  Arrival date & time: 05/01/22     Chief Complaint   Back pain History of Present Illness   Adam Crosby is a 46 y.o. year-old male with a history of hypertension presenting to the ED with chief complaint of back pain.  Pain to the right lower back ever since lifting a heavy object at work.  Felt an immediate sensation of pain, worsening over the past 2 or 3 days.  Pain does not radiate.  No numbness or weakness to the arms or legs, no bowel or bladder dysfunction, no fever.  Review of Systems  A thorough review of systems was obtained and all systems are negative except as noted in the HPI and PMH.   Patient's Health History    Past Medical History:  Diagnosis Date   Allergy    Arthritis    GERD (gastroesophageal reflux disease) 10/09/2016   Hypertension    Lumbar vertebral fracture (HCC) 01/30/2018   OSA (obstructive sleep apnea) 10/22/2016    History reviewed. No pertinent surgical history.  Family History  Problem Relation Age of Onset   Arthritis Mother    Hyperlipidemia Mother    Hypertension Mother    Varicose Veins Mother    Diabetes Daughter    Arthritis Maternal Grandmother    Cancer Maternal Grandmother    Diabetes Maternal Grandmother    Varicose Veins Maternal Grandmother    Arthritis Maternal Grandfather    Cancer Maternal Grandfather    Hearing loss Maternal Grandfather     Social History   Socioeconomic History   Marital status: Married    Spouse name: Not on file   Number of children: Not on file   Years of education: Not on file   Highest education level: Not on file  Occupational History   Not on file  Tobacco Use   Smoking status: Never   Smokeless tobacco: Never  Substance and Sexual Activity   Alcohol use: Yes    Alcohol/week: 1.0 standard drink of alcohol    Types: 1 Cans of beer per week    Comment: EVERYOTHER MONTH   Drug use: No   Sexual  activity: Yes  Other Topics Concern   Not on file  Social History Narrative   Not on file   Social Determinants of Health   Financial Resource Strain: Not on file  Food Insecurity: Not on file  Transportation Needs: Not on file  Physical Activity: Not on file  Stress: Not on file  Social Connections: Not on file  Intimate Partner Violence: Not on file     Physical Exam   Vitals:   05/01/22 0430 05/01/22 0439  BP: (!) 168/118   Pulse: 96   Resp: 18   Temp:  98.3 F (36.8 C)  SpO2: 96%     CONSTITUTIONAL: Well-appearing, NAD NEURO/PSYCH:  Alert and oriented x 3, no focal deficits EYES:  eyes equal and reactive ENT/NECK:  no LAD, no JVD CARDIO: Regular rate, well-perfused, normal S1 and S2 PULM:  CTAB no wheezing or rhonchi GI/GU:  non-distended, non-tender MSK/SPINE:  No gross deformities, no edema SKIN:  no rash, atraumatic   *Additional and/or pertinent findings included in MDM below  Diagnostic and Interventional Summary    EKG Interpretation  Date/Time:    Ventricular Rate:    PR Interval:    QRS Duration:   QT Interval:    QTC Calculation:   R Axis:  Text Interpretation:         Labs Reviewed - No data to display  No orders to display    Medications  predniSONE (DELTASONE) tablet 60 mg (has no administration in time range)     Procedures  /  Critical Care Procedures  ED Course and Medical Decision Making  Initial Impression and Ddx Patient is without numbness or weakness to the arms or legs, no bowel or bladder dysfunction, reassuring neurological exam and so nothing to suggest myelopathy.  Favoring muscle strain or spasm versus herniated disc.  No significant trauma to warrant imaging, appropriate for discharge with symptomatic management.  Past medical/surgical history that increases complexity of ED encounter: None  Interpretation of Diagnostics Laboratory and/or imaging options to aid in the diagnosis/care of the patient were  considered.  After careful history and physical examination, it was determined that there was no indication for diagnostics at this time.  Patient Reassessment and Ultimate Disposition/Management     Discharge  Patient management required discussion with the following services or consulting groups:  None  Complexity of Problems Addressed Acute complicated illness or Injury  Additional Data Reviewed and Analyzed Further history obtained from: None  Additional Factors Impacting ED Encounter Risk Prescriptions  Elmer Sow. Pilar Plate, MD [facility] Emergency Medicine [facility] [email or web address]  Final Clinical Impressions(s) / ED Diagnoses     ICD-10-CM   1. Acute right-sided low back pain without sciatica  M54.50       ED Discharge Orders          Ordered    predniSONE (DELTASONE) 20 MG tablet  Daily        05/01/22 0458    methocarbamol (ROBAXIN) 500 MG tablet  Every 8 hours PRN        05/01/22 0458    lidocaine (LIDODERM) 5 %  Every 24 hours        05/01/22 0458             Discharge Instructions Discussed with and Provided to Patient:    Discharge Instructions      You were evaluated in the Emergency Department and after careful evaluation, we did not find any emergent condition requiring admission or further testing in the hospital.  Your exam/testing today is overall reassuring.  Symptoms seem to be due to either muscle spasms or a herniated disc.  Recommend taking the prednisone anti-inflammatory medication as directed.  You can also use the numbing patches as needed.  Also recommending Tylenol 1000 mg every 4-6 hours.  The Robaxin muscle relaxers can be helpful if you are having trouble sleeping with the pain, but they can cause drowsiness.  Please return to the Emergency Department if you experience any worsening of your condition.   Thank you for allowing [place] to be a part of your care.      Sabas Sous, MD 05/01/22 [phone]

## 2022-05-01 NOTE — Discharge Instructions (Addendum)
You were evaluated in the Emergency Department and after careful evaluation, we did not find any emergent condition requiring admission or further testing in the hospital.  Your exam/testing today is overall reassuring.  Symptoms seem to be due to either muscle spasms or a herniated disc.  Recommend taking the prednisone anti-inflammatory medication as directed.  You can also use the numbing patches as needed.  Also recommending Tylenol 1000 mg every 4-6 hours.  The Robaxin muscle relaxers can be helpful if you are having trouble sleeping with the pain, but they can cause drowsiness.  Please return to the Emergency Department if you experience any worsening of your condition.   Thank you for allowing [place] to be a part of your care.

## 2022-05-01 NOTE — ED Triage Notes (Signed)
Pt arrives POV c/o right sciatica pain since Tuesday. Pt states hx of back fx that resulted in bulging disk. Pt states pain radiates down the back of his right leg.

## 2022-05-08 ENCOUNTER — Other Ambulatory Visit: Payer: Self-pay | Admitting: *Deleted

## 2022-05-08 ENCOUNTER — Telehealth: Payer: Self-pay | Admitting: Internal Medicine

## 2022-05-08 DIAGNOSIS — I10 Essential (primary) hypertension: Secondary | ICD-10-CM

## 2022-05-08 MED ORDER — METOPROLOL SUCCINATE ER 25 MG PO TB24: 25.0000 mg | ORAL_TABLET | Freq: Every day | ORAL | 3 refills | Status: DC

## 2022-05-08 NOTE — Telephone Encounter (Signed)
Pt called stating he was seen in ER for back pain & dx with herniated disc. He is sche for 7/18. He also is out of one of his bp medications & states his bp has been running high. He is unsure if it is from pain or being out of the medication for 2 months. Please advise    metoprolol succinate (TOPROL-XL) 25 MG 24 hr tablet    [address]

## 2022-05-08 NOTE — Telephone Encounter (Signed)
Please move his appt up with any provider will send refill on medication

## 2022-05-08 NOTE — Telephone Encounter (Signed)
Also I lvm for pt please let him know medication was sent to [facility] in [place] and try to get him in on Monday if possible also wanted to let pt know to go to er or urgent care if bp spikes high over the weekend

## 2022-05-11 ENCOUNTER — Ambulatory Visit: Payer: BC Managed Care – PPO | Admitting: Internal Medicine

## 2022-05-11 ENCOUNTER — Ambulatory Visit (HOSPITAL_COMMUNITY)
Admission: RE | Admit: 2022-05-11 | Discharge: 2022-05-11 | Disposition: A | Discharge status: Home / Self Care | Payer: BC Managed Care – PPO | Source: Ambulatory Visit | Attending: Internal Medicine | Admitting: Internal Medicine

## 2022-05-11 ENCOUNTER — Encounter: Payer: Self-pay | Admitting: Internal Medicine

## 2022-05-11 VITALS — BP 138/90 | HR 91 | Resp 16 | Ht 68.0 in | Wt 285.0 lb

## 2022-05-11 DIAGNOSIS — Z09 Encounter for follow-up examination after completed treatment for conditions other than malignant neoplasm: Secondary | ICD-10-CM | POA: Diagnosis not present

## 2022-05-11 DIAGNOSIS — M5441 Lumbago with sciatica, right side: Secondary | ICD-10-CM | POA: Diagnosis not present

## 2022-05-11 DIAGNOSIS — G8929 Other chronic pain: Secondary | ICD-10-CM | POA: Insufficient documentation

## 2022-05-11 DIAGNOSIS — I10 Essential (primary) hypertension: Secondary | ICD-10-CM | POA: Diagnosis not present

## 2022-05-11 DIAGNOSIS — M545 Low back pain, unspecified: Secondary | ICD-10-CM | POA: Diagnosis not present

## 2022-05-11 HISTORY — DX: Lumbago with sciatica, right side: M54.41

## 2022-05-11 MED ORDER — METHYLPREDNISOLONE ACETATE 80 MG/ML IJ SUSP
80.0000 mg | Freq: Once | INTRAMUSCULAR | Status: AC
  Administered 2022-05-11: 80 mg via INTRAMUSCULAR

## 2022-05-11 MED ORDER — METOPROLOL SUCCINATE ER 25 MG PO TB24: 25.0000 mg | ORAL_TABLET | Freq: Every day | ORAL | 3 refills | Status: DC

## 2022-05-11 MED ORDER — LOSARTAN POTASSIUM 100 MG PO TABS: 100.0000 mg | ORAL_TABLET | Freq: Every day | ORAL | 3 refills | Status: DC

## 2022-05-11 MED ORDER — IBUPROFEN 800 MG PO TABS: 800.0000 mg | ORAL_TABLET | Freq: Three times a day (TID) | ORAL | 0 refills | Status: DC | PRN

## 2022-05-11 MED ORDER — KETOROLAC TROMETHAMINE 60 MG/2ML IM SOLN
60.0000 mg | Freq: Once | INTRAMUSCULAR | Status: AC
  Administered 2022-05-11: 60 mg via INTRAMUSCULAR

## 2022-05-11 NOTE — Patient Instructions (Addendum)
Please take Ibuprofen as needed for back pain.  Please avoid heavy lifting (more than 15 lbs) and frequent bending.  Please perform simple back exercises.  Please continue to take other medications as prescribed.  Please follow low salt diet and ambulate as tolerated.

## 2022-05-11 NOTE — Assessment & Plan Note (Addendum)
Due to work-related injury Advised to avoid heavy lifting and frequent bending -work note provided Depo-Medrol and Toradol IM today Ibuprofen 800 mg TID PRN Check x-ray lumbar spine, especially as he has history of lumbar compression fractures Had drowsiness with Robaxin
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
## 2022-05-11 NOTE — Assessment & Plan Note (Signed)
Diet modification and moderate exercise/walking at least 150 mins/week DASH diet material provided 

## 2022-05-11 NOTE — Assessment & Plan Note (Signed)
ER chart reviewed Acute low back pain - still present, s/p oral steroids

## 2022-05-11 NOTE — Assessment & Plan Note (Signed)
BP Readings from Last 1 Encounters:  05/11/22 138/90   Mildly elevated today, could be due to pain Usually well-controlled with Losartan and Metoprolol Likely a component of OSA as well Counseled for compliance with the medications Advised DASH diet and moderate exercise/walking, at least 150 mins/week

## 2022-05-13 ENCOUNTER — Telehealth: Payer: Self-pay | Admitting: *Deleted

## 2022-05-13 ENCOUNTER — Telehealth: Payer: Self-pay

## 2022-05-13 NOTE — Telephone Encounter (Signed)
Patient called left voicemail for nurse Edson Snowball to return call.

## 2022-05-13 NOTE — Telephone Encounter (Signed)
Pt advised of xray results with verbal understanding  

## 2022-05-13 NOTE — Telephone Encounter (Signed)
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   Please advise

## 2022-05-13 NOTE — Telephone Encounter (Signed)
LVM for pt letting him know 

## 2022-05-26 ENCOUNTER — Ambulatory Visit: Payer: BC Managed Care – PPO | Admitting: Internal Medicine

## 2022-05-28 IMAGING — DX DG CHEST 1V PORT
1 series · 1 of 1 positions shown · non-contrast
Comparison: June 29, 2017

CLINICAL DATA: Chest pain

EXAM:
PORTABLE CHEST 1 VIEW

[chest ap]
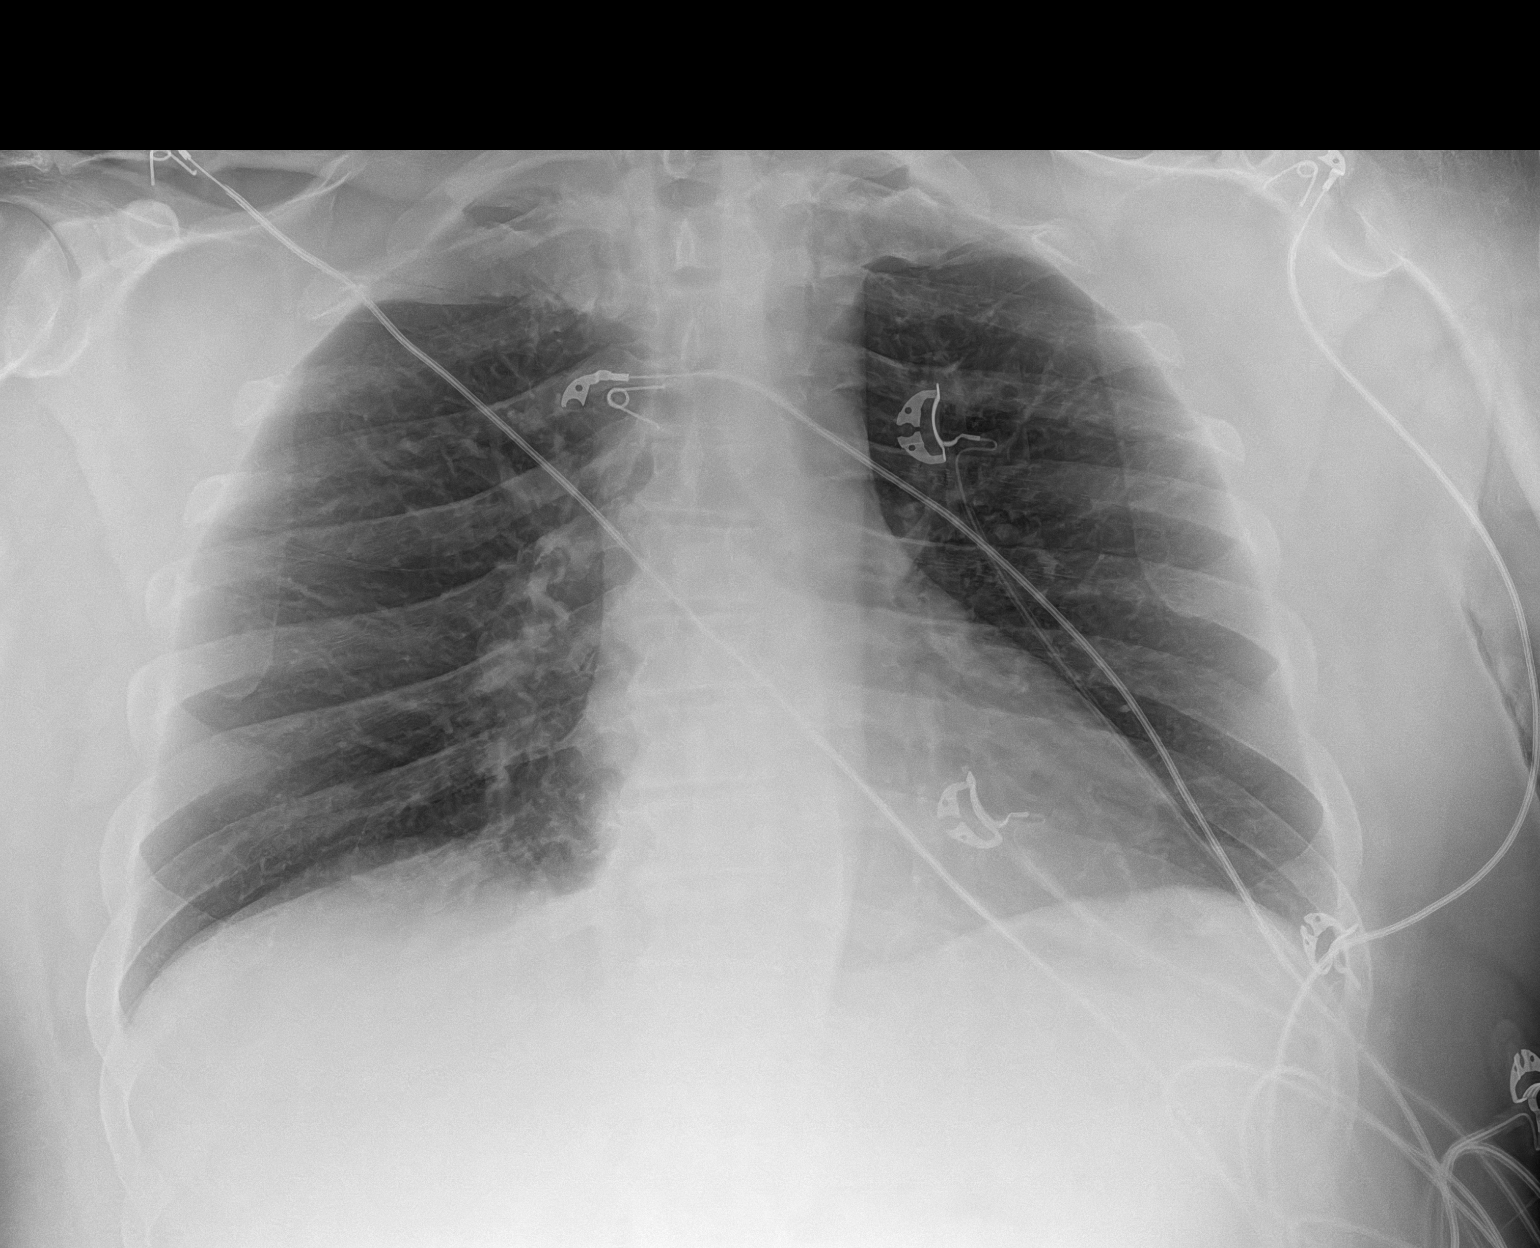

[1 of 1 positions shown; findings below may reference images not displayed]

FINDINGS: Lungs are clear. Heart size and pulmonary vascularity are normal. No
adenopathy. No pneumothorax. No bone lesions.
IMPRESSION: Lungs clear.  Cardiac silhouette normal.

## 2022-06-15 ENCOUNTER — Encounter: Payer: Self-pay | Admitting: Internal Medicine

## 2022-06-15 ENCOUNTER — Other Ambulatory Visit: Payer: Self-pay | Admitting: Internal Medicine

## 2022-06-15 DIAGNOSIS — Z8781 Personal history of (healed) traumatic fracture: Secondary | ICD-10-CM | POA: Insufficient documentation

## 2022-06-15 DIAGNOSIS — G8929 Other chronic pain: Secondary | ICD-10-CM

## 2022-06-15 MED ORDER — NAPROXEN 500 MG PO TABS: 500.0000 mg | ORAL_TABLET | Freq: Two times a day (BID) | ORAL | 0 refills | Status: DC

## 2022-07-09 ENCOUNTER — Other Ambulatory Visit: Payer: Self-pay | Admitting: Internal Medicine

## 2022-07-09 DIAGNOSIS — G8929 Other chronic pain: Secondary | ICD-10-CM

## 2022-07-28 ENCOUNTER — Other Ambulatory Visit: Payer: Self-pay | Admitting: Internal Medicine

## 2022-07-28 DIAGNOSIS — G8929 Other chronic pain: Secondary | ICD-10-CM

## 2022-08-11 ENCOUNTER — Encounter: Payer: BC Managed Care – PPO | Admitting: Internal Medicine

## 2022-09-24 ENCOUNTER — Encounter: Payer: Self-pay | Admitting: Internal Medicine

## 2022-09-30 ENCOUNTER — Other Ambulatory Visit: Payer: Self-pay | Admitting: Internal Medicine

## 2022-09-30 DIAGNOSIS — G8929 Other chronic pain: Secondary | ICD-10-CM

## 2022-10-18 ENCOUNTER — Other Ambulatory Visit: Payer: Self-pay | Admitting: Internal Medicine

## 2022-10-18 DIAGNOSIS — G8929 Other chronic pain: Secondary | ICD-10-CM

## 2022-10-21 ENCOUNTER — Encounter: Payer: Self-pay | Admitting: Internal Medicine

## 2022-10-23 ENCOUNTER — Ambulatory Visit: Payer: BC Managed Care – PPO | Admitting: Internal Medicine

## 2022-10-23 ENCOUNTER — Encounter: Payer: Self-pay | Admitting: Internal Medicine

## 2022-10-23 VITALS — BP 168/96 | HR 103 | Ht 69.0 in | Wt 310.2 lb

## 2022-10-23 DIAGNOSIS — I10 Essential (primary) hypertension: Secondary | ICD-10-CM | POA: Diagnosis not present

## 2022-10-23 DIAGNOSIS — R369 Urethral discharge, unspecified: Secondary | ICD-10-CM | POA: Insufficient documentation

## 2022-10-23 DIAGNOSIS — G4733 Obstructive sleep apnea (adult) (pediatric): Secondary | ICD-10-CM | POA: Diagnosis not present

## 2022-10-23 DIAGNOSIS — Z113 Encounter for screening for infections with a predominantly sexual mode of transmission: Secondary | ICD-10-CM | POA: Diagnosis not present

## 2022-10-23 LAB — POCT URINALYSIS DIP (CLINITEK)
Bilirubin, UA: NEGATIVE
Glucose, UA: NEGATIVE mg/dL
Ketones, POC UA: NEGATIVE mg/dL
Nitrite, UA: NEGATIVE
POC PROTEIN,UA: 30 — AB
Spec Grav, UA: 1.025 (ref 1.010–1.025)
Urobilinogen, UA: 0.2 E.U./dL
pH, UA: 6.5 (ref 5.0–8.0)

## 2022-10-23 MED ORDER — NIFEDIPINE ER OSMOTIC RELEASE 60 MG PO TB24: 60.0000 mg | ORAL_TABLET | Freq: Every day | ORAL | 0 refills | Status: DC

## 2022-10-23 MED ORDER — AZITHROMYCIN 500 MG PO TABS
1000.0000 mg | ORAL_TABLET | Freq: Every day | ORAL | 0 refills | Status: DC
Start: 2022-10-23 — End: 2022-11-16

## 2022-10-23 NOTE — Assessment & Plan Note (Signed)
Snoring, chronic fatigue, apneic episodes and uncontrolled HTN in association with morbid obesity STOP-BANG: 7 At high risk for moderate to severe OSA Ordered home sleep study again 

## 2022-10-23 NOTE — Assessment & Plan Note (Signed)
Diet modification and moderate exercise/walking at least 150 mins/week DASH diet material provided

## 2022-10-23 NOTE — Progress Notes (Signed)
Established Patient Office Visit  Subjective:  Patient ID: Adam Crosby, male    DOB: 05/31/1976  Age: 46 y.o. MRN: [id number]  CC:  Chief Complaint  Patient presents with   Penile Discharge    Patient states Sunday he noticed a discharge coming from penis. No pain.  He also states his blood pressure has been high. He also states he is not sleeping well at night but will randomly fall asleep anywhere    HPI Adam Crosby is a 46 y.o. male with past medical history of HTN and morbid obesity who presents for f/u of her chronic medical conditions.  He complains of urethral discharge, but is looking.  He has noticed discharge on an intermittent basis since 10/19/22.  He denies any fever, chills, dysuria, hematuria, nausea or vomiting.  He reports that he had sexual relationship with a male partner recently.  He also has been having high BP readings at home.  His BP was significantly elevated today.  He has had intermittent headache, but denies dizziness, chest pain, dyspnea or palpitations.  He has had insomnia, chronic fatigue and hypnic episodes at nighttime.  We have tried to get home sleep study done in the past, but he could not do it.  He agrees to get sleep study done now.  Past Medical History:  Diagnosis Date   Acute right-sided low back pain with right-sided sciatica 05/11/2022   Allergy    Arthritis    GERD (gastroesophageal reflux disease) 10/09/2016   Hypertension    Lumbar vertebral fracture (HCC) 01/30/2018   OSA (obstructive sleep apnea) 10/22/2016    History reviewed. No pertinent surgical history.  Family History  Problem Relation Age of Onset   Arthritis Mother    Hyperlipidemia Mother    Hypertension Mother    Varicose Veins Mother    Diabetes Daughter    Arthritis Maternal Grandmother    Cancer Maternal Grandmother    Diabetes Maternal Grandmother    Varicose Veins Maternal Grandmother    Arthritis Maternal Grandfather    Cancer Maternal  Grandfather    Hearing loss Maternal Grandfather     Social History   Socioeconomic History   Marital status: Divorced    Spouse name: Not on file   Number of children: Not on file   Years of education: Not on file   Highest education level: Not on file  Occupational History   Not on file  Tobacco Use   Smoking status: Never   Smokeless tobacco: Never  Substance and Sexual Activity   Alcohol use: Yes    Alcohol/week: 1.0 standard drink of alcohol    Types: 1 Cans of beer per week    Comment: EVERYOTHER MONTH   Drug use: No   Sexual activity: Yes  Other Topics Concern   Not on file  Social History Narrative   Not on file   Social Determinants of Health   Financial Resource Strain: Not on file  Food Insecurity: Not on file  Transportation Needs: Not on file  Physical Activity: Not on file  Stress: Not on file  Social Connections: Not on file  Intimate Partner Violence: Not on file    Outpatient Medications Prior to Visit  Medication Sig Dispense Refill   albuterol (VENTOLIN HFA) 108 (90 Base) MCG/ACT inhaler Inhale 2 puffs into the lungs every 4 (four) hours as needed for wheezing or shortness of breath. 18 g 0   lidocaine (LIDODERM) 5 % Place 1 patch onto the  skin daily. Remove & Discard patch within 12 hours or as directed by MD 5 patch 0   losartan (COZAAR) 100 MG tablet Take 1 tablet (100 mg total) by mouth daily. 30 tablet 3   methocarbamol (ROBAXIN) 500 MG tablet Take 1 tablet (500 mg total) by mouth every 8 (eight) hours as needed for muscle spasms. 30 tablet 0   metoprolol succinate (TOPROL-XL) 25 MG 24 hr tablet Take 1 tablet (25 mg total) by mouth daily. 30 tablet 3   naproxen (NAPROSYN) 500 MG tablet TAKE 1 TABLET(500 MG) BY MOUTH TWICE DAILY WITH A MEAL 30 tablet 0   No facility-administered medications prior to visit.    No Known Allergies  ROS Review of Systems  Constitutional:  Negative for chills and fever.  HENT:  Negative for congestion and  sore throat.   Eyes:  Negative for pain and discharge.  Respiratory:  Negative for cough and shortness of breath.   Cardiovascular:  Negative for chest pain and palpitations.  Gastrointestinal:  Negative for diarrhea, nausea and vomiting.  Endocrine: Negative for polydipsia and polyuria.  Genitourinary:  Positive for penile discharge. Negative for dysuria and hematuria.  Musculoskeletal:  Positive for back pain. Negative for neck pain and neck stiffness.  Skin:  Negative for rash.  Neurological:  Negative for dizziness, weakness, numbness and headaches.  Psychiatric/Behavioral:  Negative for agitation and behavioral problems.       Objective:    Physical Exam Vitals reviewed.  Constitutional:      General: He is not in acute distress.    Appearance: He is obese. He is not diaphoretic.  HENT:     Head: Normocephalic and atraumatic.     Nose: Nose normal.     Mouth/Throat:     Mouth: Mucous membranes are moist.  Eyes:     General: No scleral icterus.    Extraocular Movements: Extraocular movements intact.  Cardiovascular:     Rate and Rhythm: Normal rate and regular rhythm.     Pulses: Normal pulses.     Heart sounds: Normal heart sounds. No murmur heard. Pulmonary:     Breath sounds: Normal breath sounds. No wheezing or rales.  Musculoskeletal:     Cervical back: Neck supple.     Right lower leg: No edema.     Left lower leg: No edema.  Skin:    General: Skin is warm.     Findings: No rash.  Neurological:     General: No focal deficit present.     Mental Status: He is alert and oriented to person, place, and time.  Psychiatric:        Mood and Affect: Mood normal.        Behavior: Behavior normal.     BP (!) 168/96 (BP Location: Left Arm, Cuff Size: Normal)   Pulse (!) 103   Ht _0  (1.753 m)   Wt (!) 310 lb 3.2 oz (140.7 kg)   SpO2 93%   BMI 45.81 kg/m  Wt Readings from Last 3 Encounters:  10/23/22 (!) 310 lb 3.2 oz (140.7 kg)  05/11/22 285 lb (129.3 kg)   05/01/22 279 lb 15.8 oz (127 kg)    Lab Results  Component Value Date   TSH 1.350 07/16/2021   Lab Results  Component Value Date   WBC 5.5 07/16/2021   HGB 16.6 07/16/2021   HCT 48.8 07/16/2021   MCV 82 07/16/2021   PLT 294 07/16/2021   Lab Results  Component Value Date  NA 141 07/16/2021   K 3.9 07/16/2021   CO2 28 07/16/2021   GLUCOSE 94 07/16/2021   BUN 11 07/16/2021   CREATININE 1.25 07/16/2021   BILITOT 0.8 07/16/2021   ALKPHOS 69 07/16/2021   AST 15 07/16/2021   ALT 20 07/16/2021   PROT 7.3 07/16/2021   ALBUMIN 4.2 07/16/2021   CALCIUM 9.0 07/16/2021   ANIONGAP 9 05/19/2020   EGFR 73 07/16/2021   Lab Results  Component Value Date   CHOL 161 07/16/2021   Lab Results  Component Value Date   HDL 41 07/16/2021   Lab Results  Component Value Date   LDLCALC 98 07/16/2021   Lab Results  Component Value Date   TRIG 122 07/16/2021   Lab Results  Component Value Date   CHOLHDL 3.9 07/16/2021   Lab Results  Component Value Date   HGBA1C 5.8 (H) 07/16/2021      Assessment & Plan:   Problem List Items Addressed This Visit       Cardiovascular and Mediastinum   Essential hypertension    BP Readings from Last 1 Encounters:  10/23/22 (!) 168/96   Uncontrolled with Losartan and Metoprolol Added nifedipine 60 mg daily Likely a component of OSA as well Counseled for compliance with the medications Advised DASH diet and moderate exercise/walking, at least 150 mins/week      Relevant Medications   NIFEdipine (PROCARDIA XL/NIFEDICAL XL) 60 MG 24 hr tablet     Respiratory   OSA (obstructive sleep apnea)    Snoring, chronic fatigue, apneic episodes and uncontrolled HTN in association with morbid obesity STOP-BANG: 7 At high risk for moderate to severe OSA Ordered home sleep study again        Other   Morbid obesity (HCC)    Diet modification and moderate exercise/walking at least 150 mins/week DASH diet material provided      Urethral  discharge - Primary    Concern for urethritis Check GC and chlamydia testing Check HIV, hep B and RPR for STD screening Empiric azithromycin for chlamydia coverage      Relevant Medications   azithromycin (ZITHROMAX) 500 MG tablet   Other Relevant Orders   POCT URINALYSIS DIP (CLINITEK) (Completed)   HIV antibody (with reflex)   Hepatitis B Surface AntiGEN   RPR   Chlamydia/GC NAA, Confirmation   Urine Culture   Other Visit Diagnoses     Screening for STDs (sexually transmitted diseases)       Relevant Orders   HIV antibody (with reflex)   Hepatitis B Surface AntiGEN   RPR   Chlamydia/GC NAA, Confirmation       Meds ordered this encounter  Medications   azithromycin (ZITHROMAX) 500 MG tablet    Sig: Take 2 tablets (1,000 mg total) by mouth daily.    Dispense:  2 tablet    Refill:  0   NIFEdipine (PROCARDIA XL/NIFEDICAL XL) 60 MG 24 hr tablet    Sig: Take 1 tablet (60 mg total) by mouth daily.    Dispense:  30 tablet    Refill:  0    Follow-up: Return in about 3 weeks (around 11/13/2022), or if symptoms worsen or fail to improve.    Lindell Spar, MD

## 2022-10-23 NOTE — Assessment & Plan Note (Addendum)
Concern for urethritis Check GC and chlamydia testing Check HIV, hep B and RPR for STD screening Empiric azithromycin for chlamydia coverage

## 2022-10-23 NOTE — Patient Instructions (Signed)
Please continue taking Nifedipine as prescribed.  Please continue to follow DASH diet and perform moderate exercise/walking at least 150 mins/week.

## 2022-10-23 NOTE — Assessment & Plan Note (Signed)
BP Readings from Last 1 Encounters:  10/23/22 (!) 168/96    Uncontrolled with Losartan and Metoprolol Added nifedipine 60 mg daily Likely a component of OSA as well Counseled for compliance with the medications Advised DASH diet and moderate exercise/walking, at least 150 mins/week

## 2022-10-24 LAB — HIV ANTIBODY (ROUTINE TESTING W REFLEX): HIV Screen 4th Generation wRfx: NONREACTIVE

## 2022-10-24 LAB — RPR: RPR Ser Ql: NONREACTIVE

## 2022-10-24 LAB — HEPATITIS B SURFACE ANTIGEN: Hepatitis B Surface Ag: NEGATIVE

## 2022-10-27 LAB — URINE CULTURE

## 2022-10-31 LAB — CHLAMYDIA/GC NAA, CONFIRMATION
Chlamydia trachomatis, NAA: POSITIVE — AB
Neisseria gonorrhoeae, NAA: POSITIVE — AB

## 2022-10-31 LAB — N. GONORRHOEAE NAA, CONFIRM: N. gonorrhoeae NAA, Confirm: POSITIVE — AB

## 2022-10-31 LAB — C. TRACHOMATIS NAA, CONFIRM: C. trachomatis NAA, Confirm: POSITIVE — AB

## 2022-11-02 ENCOUNTER — Encounter: Payer: Self-pay | Admitting: Internal Medicine

## 2022-11-02 ENCOUNTER — Other Ambulatory Visit: Payer: Self-pay | Admitting: Internal Medicine

## 2022-11-02 DIAGNOSIS — I10 Essential (primary) hypertension: Secondary | ICD-10-CM

## 2022-11-03 ENCOUNTER — Ambulatory Visit (INDEPENDENT_AMBULATORY_CARE_PROVIDER_SITE_OTHER): Payer: BC Managed Care – PPO

## 2022-11-03 ENCOUNTER — Other Ambulatory Visit: Payer: Self-pay | Admitting: Internal Medicine

## 2022-11-03 DIAGNOSIS — Z113 Encounter for screening for infections with a predominantly sexual mode of transmission: Secondary | ICD-10-CM | POA: Diagnosis not present

## 2022-11-03 DIAGNOSIS — I10 Essential (primary) hypertension: Secondary | ICD-10-CM

## 2022-11-03 MED ORDER — CHLORTHALIDONE 25 MG PO TABS: 12.5000 mg | ORAL_TABLET | Freq: Every day | ORAL | 0 refills | Status: DC

## 2022-11-03 MED ORDER — CEFTRIAXONE SODIUM 1 G IJ SOLR
1.0000 g | Freq: Once | INTRAMUSCULAR | Status: AC
  Administered 2022-11-03: 1 g via INTRAMUSCULAR

## 2022-11-04 ENCOUNTER — Other Ambulatory Visit: Payer: Self-pay | Admitting: Internal Medicine

## 2022-11-04 DIAGNOSIS — G8929 Other chronic pain: Secondary | ICD-10-CM

## 2022-11-16 ENCOUNTER — Encounter: Payer: Self-pay | Admitting: Internal Medicine

## 2022-11-16 ENCOUNTER — Ambulatory Visit (INDEPENDENT_AMBULATORY_CARE_PROVIDER_SITE_OTHER): Payer: BC Managed Care – PPO | Admitting: Internal Medicine

## 2022-11-16 VITALS — BP 121/73 | HR 105 | Ht 69.0 in | Wt 308.0 lb

## 2022-11-16 DIAGNOSIS — F4321 Adjustment disorder with depressed mood: Secondary | ICD-10-CM

## 2022-11-16 DIAGNOSIS — R7303 Prediabetes: Secondary | ICD-10-CM | POA: Diagnosis not present

## 2022-11-16 DIAGNOSIS — Z23 Encounter for immunization: Secondary | ICD-10-CM

## 2022-11-16 DIAGNOSIS — Z0001 Encounter for general adult medical examination with abnormal findings: Secondary | ICD-10-CM | POA: Diagnosis not present

## 2022-11-16 DIAGNOSIS — G4733 Obstructive sleep apnea (adult) (pediatric): Secondary | ICD-10-CM | POA: Diagnosis not present

## 2022-11-16 DIAGNOSIS — I10 Essential (primary) hypertension: Secondary | ICD-10-CM

## 2022-11-16 DIAGNOSIS — N529 Male erectile dysfunction, unspecified: Secondary | ICD-10-CM

## 2022-11-16 HISTORY — DX: Encounter for general adult medical examination with abnormal findings: Z00.01

## 2022-11-16 MED ORDER — NIFEDIPINE ER OSMOTIC RELEASE 60 MG PO TB24: 60.0000 mg | ORAL_TABLET | Freq: Every day | ORAL | 3 refills | Status: DC

## 2022-11-16 MED ORDER — SILDENAFIL CITRATE 50 MG PO TABS: 50.0000 mg | ORAL_TABLET | Freq: Every day | ORAL | 2 refills | Status: DC | PRN

## 2022-11-16 MED ORDER — CHLORTHALIDONE 25 MG PO TABS: 12.5000 mg | ORAL_TABLET | Freq: Every day | ORAL | 1 refills | Status: DC

## 2022-11-16 NOTE — Assessment & Plan Note (Signed)
Likely due to recent divorce process Has multiple components of sleep apnea symptoms Would avoid adding antidepressant for now

## 2022-11-16 NOTE — Assessment & Plan Note (Signed)
Likely medication induced Morbid obesity and OSA can also be contributory Started sildenafil PRN

## 2022-11-16 NOTE — Assessment & Plan Note (Signed)
Snoring, chronic fatigue, apneic episodes and uncontrolled HTN in association with morbid obesity STOP-BANG: 7 At high risk for moderate to severe OSA Ordered home sleep study again

## 2022-11-16 NOTE — Assessment & Plan Note (Signed)

## 2022-11-16 NOTE — Progress Notes (Signed)
Established Patient Office Visit  Subjective:  Patient ID: Adam Crosby, male    DOB: 06-18-1976  Age: 47 y.o. MRN: [id number]  CC:  Chief Complaint  Patient presents with   Annual Exam    HPI Adam Crosby is a 47 y.o. male with past medical history of HTN and morbid obesity who presents for annual physical.  History: His blood pressure has significantly improved now.  He still has leg swelling, but has improved with chlorthalidone now.  He denies any headache, dizziness, chest pain, dyspnea or palpitations.  He still had has not had sleep study done.  He agrees to contact [facility] to get sleep study done.  He reports fatigue and insomnia at nighttime, but daytime hypersomnolence.  He recently went through divorce process and has been feeling down lately, but he has noticed some improvement in the last 2 weeks.  Denies any SI or HI currently.  He reports erectile dysfunction lately.  Denies any dysuria or hematuria.  Past Medical History:  Diagnosis Date   Acute right-sided low back pain with right-sided sciatica 05/11/2022   Allergy    Arthritis    Encounter for general adult medical examination with abnormal findings 11/16/2022   GERD (gastroesophageal reflux disease) 10/09/2016   Hypertension    Lumbar vertebral fracture ([place]) 01/30/2018   OSA (obstructive sleep apnea) 10/22/2016    History reviewed. No pertinent surgical history.  Family History  Problem Relation Age of Onset   Arthritis Mother    Hyperlipidemia Mother    Hypertension Mother    Varicose Veins Mother    Diabetes Daughter    Arthritis Maternal Grandmother    Cancer Maternal Grandmother    Diabetes Maternal Grandmother    Varicose Veins Maternal Grandmother    Arthritis Maternal Grandfather    Cancer Maternal Grandfather    Hearing loss Maternal Grandfather     Social History   Socioeconomic History   Marital status: Divorced    Spouse name: Not on file   Number of children: Not  on file   Years of education: Not on file   Highest education level: Not on file  Occupational History   Not on file  Tobacco Use   Smoking status: Never   Smokeless tobacco: Never  Substance and Sexual Activity   Alcohol use: Yes    Alcohol/week: 1.0 standard drink of alcohol    Types: 1 Cans of beer per week    Comment: EVERYOTHER MONTH   Drug use: No   Sexual activity: Yes  Other Topics Concern   Not on file  Social History Narrative   Not on file   Social Determinants of Health   Financial Resource Strain: Not on file  Food Insecurity: Not on file  Transportation Needs: Not on file  Physical Activity: Not on file  Stress: Not on file  Social Connections: Not on file  Intimate Partner Violence: Not on file    Outpatient Medications Prior to Visit  Medication Sig Dispense Refill   albuterol (VENTOLIN HFA) 108 (90 Base) MCG/ACT inhaler Inhale 2 puffs into the lungs every 4 (four) hours as needed for wheezing or shortness of breath. 18 g 0   lidocaine (LIDODERM) 5 % Place 1 patch onto the skin daily. Remove & Discard patch within 12 hours or as directed by MD 5 patch 0   losartan (COZAAR) 100 MG tablet TAKE 1 TABLET(100 MG) BY MOUTH DAILY 30 tablet 3   methocarbamol (ROBAXIN) 500 MG tablet  Take 1 tablet (500 mg total) by mouth every 8 (eight) hours as needed for muscle spasms. 30 tablet 0   metoprolol succinate (TOPROL-XL) 25 MG 24 hr tablet TAKE 1 TABLET(25 MG) BY MOUTH DAILY 30 tablet 3   naproxen (NAPROSYN) 500 MG tablet TAKE 1 TABLET(500 MG) BY MOUTH TWICE DAILY WITH A MEAL 30 tablet 0   azithromycin (ZITHROMAX) 500 MG tablet Take 2 tablets (1,000 mg total) by mouth daily. 2 tablet 0   chlorthalidone (HYGROTON) 25 MG tablet Take 0.5 tablets (12.5 mg total) by mouth daily. 15 tablet 0   NIFEdipine (PROCARDIA XL/NIFEDICAL XL) 60 MG 24 hr tablet Take 1 tablet (60 mg total) by mouth daily. 30 tablet 0   No facility-administered medications prior to visit.    No Known  Allergies  ROS Review of Systems  Constitutional:  Negative for chills and fever.  HENT:  Negative for congestion and sore throat.   Eyes:  Negative for pain and discharge.  Respiratory:  Negative for cough and shortness of breath.   Cardiovascular:  Positive for leg swelling. Negative for chest pain and palpitations.  Gastrointestinal:  Negative for diarrhea, nausea and vomiting.  Endocrine: Negative for polydipsia and polyuria.  Genitourinary:  Negative for dysuria and hematuria.  Musculoskeletal:  Positive for back pain. Negative for neck pain and neck stiffness.  Skin:  Negative for rash.  Neurological:  Negative for dizziness, weakness, numbness and headaches.  Psychiatric/Behavioral:  Positive for dysphoric mood and sleep disturbance. Negative for agitation and behavioral problems.       Objective:    Physical Exam Vitals reviewed.  Constitutional:      General: He is not in acute distress.    Appearance: He is obese. He is not diaphoretic.  HENT:     Head: Normocephalic and atraumatic.     Nose: Nose normal.     Mouth/Throat:     Mouth: Mucous membranes are moist.  Eyes:     General: No scleral icterus.    Extraocular Movements: Extraocular movements intact.  Cardiovascular:     Rate and Rhythm: Normal rate and regular rhythm.     Pulses: Normal pulses.     Heart sounds: Normal heart sounds. No murmur heard. Pulmonary:     Breath sounds: Normal breath sounds. No wheezing or rales.  Abdominal:     Palpations: Abdomen is soft.     Tenderness: There is no abdominal tenderness.  Musculoskeletal:     Cervical back: Neck supple.     Right lower leg: Edema (1+) present.     Left lower leg: Edema (1+) present.  Skin:    General: Skin is warm.     Findings: No rash.  Neurological:     General: No focal deficit present.     Mental Status: He is alert and oriented to person, place, and time.     Cranial Nerves: No cranial nerve deficit.     Sensory: No sensory  deficit.     Motor: No weakness.  Psychiatric:        Mood and Affect: Mood normal.        Behavior: Behavior normal.     BP 121/73 (BP Location: Left Arm, Patient Position: Sitting, Cuff Size: Large)   Pulse (!) 105   Ht 5\' 9"  (1.753 m)   Wt (!) 308 lb (139.7 kg)   SpO2 94%   BMI 45.48 kg/m  Wt Readings from Last 3 Encounters:  11/16/22 (!) 308 lb (139.7 kg)  10/23/22 (!) 310 lb 3.2 oz (140.7 kg)  05/11/22 285 lb (129.3 kg)    Lab Results  Component Value Date   TSH 1.350 07/16/2021   Lab Results  Component Value Date   WBC 5.5 07/16/2021   HGB 16.6 07/16/2021   HCT 48.8 07/16/2021   MCV 82 07/16/2021   PLT 294 07/16/2021   Lab Results  Component Value Date   NA 141 07/16/2021   K 3.9 07/16/2021   CO2 28 07/16/2021   GLUCOSE 94 07/16/2021   BUN 11 07/16/2021   CREATININE 1.25 07/16/2021   BILITOT 0.8 07/16/2021   ALKPHOS 69 07/16/2021   AST 15 07/16/2021   ALT 20 07/16/2021   PROT 7.3 07/16/2021   ALBUMIN 4.2 07/16/2021   CALCIUM 9.0 07/16/2021   ANIONGAP 9 05/19/2020   EGFR 73 07/16/2021   Lab Results  Component Value Date   CHOL 161 07/16/2021   Lab Results  Component Value Date   HDL 41 07/16/2021   Lab Results  Component Value Date   LDLCALC 98 07/16/2021   Lab Results  Component Value Date   TRIG 122 07/16/2021   Lab Results  Component Value Date   CHOLHDL 3.9 07/16/2021   Lab Results  Component Value Date   HGBA1C 5.8 (H) 07/16/2021      Assessment & Plan:   Problem List Items Addressed This Visit       Cardiovascular and Mediastinum   Essential hypertension    BP Readings from Last 1 Encounters:  11/16/22 121/73  Well-controlled with Losartan 100 mg QD, Metoprolol 25 mg QD, Nifedipine 60 mg QD and Chlorthalidone 12.5 mg QD Likely a component of OSA as well Counseled for compliance with the medications Advised DASH diet and moderate exercise/walking, at least 150 mins/week      Relevant Medications   chlorthalidone  (HYGROTON) 25 MG tablet   NIFEdipine (PROCARDIA XL/NIFEDICAL XL) 60 MG 24 hr tablet   sildenafil (VIAGRA) 50 MG tablet   Other Relevant Orders   TSH   Lipid panel   CMP14+EGFR   CBC with Differential/Platelet     Respiratory   OSA (obstructive sleep apnea)    Snoring, chronic fatigue, apneic episodes and uncontrolled HTN in association with morbid obesity STOP-BANG: 7 At high risk for moderate to severe OSA Ordered home sleep study again        Other   Encounter for general adult medical examination with abnormal findings - Primary    Physical exam as documented. Counseling done  re healthy lifestyle involving commitment to 150 minutes exercise per week, heart healthy diet, and attaining healthy weight.The importance of adequate sleep also discussed. Changes in health habits are decided on by the patient with goals and time frames  set for achieving them. Immunization and cancer screening needs are specifically addressed at this visit.      Relevant Orders   VITAMIN D 25 Hydroxy (Vit-D Deficiency, Fractures)   TSH   Lipid panel   Hemoglobin A1c   CMP14+EGFR   CBC with Differential/Platelet   Adjustment disorder with depressed mood    Likely due to recent divorce process Has multiple components of sleep apnea symptoms Would avoid adding antidepressant for now      Relevant Orders   TSH   CMP14+EGFR   Erectile dysfunction    Likely medication induced Morbid obesity and OSA can also be contributory Started sildenafil PRN      Relevant Medications   sildenafil (VIAGRA) 50 MG tablet  Other Visit Diagnoses     Prediabetes       Relevant Orders   Hemoglobin A1c   Need for tetanus booster       Relevant Orders   Tdap vaccine greater than or equal to 7yo IM (Completed)       Meds ordered this encounter  Medications   chlorthalidone (HYGROTON) 25 MG tablet    Sig: Take 0.5 tablets (12.5 mg total) by mouth daily.    Dispense:  45 tablet    Refill:  1    NIFEdipine (PROCARDIA XL/NIFEDICAL XL) 60 MG 24 hr tablet    Sig: Take 1 tablet (60 mg total) by mouth daily.    Dispense:  30 tablet    Refill:  3   sildenafil (VIAGRA) 50 MG tablet    Sig: Take 1 tablet (50 mg total) by mouth daily as needed for erectile dysfunction.    Dispense:  10 tablet    Refill:  2    Follow-up: Return in about 4 months (around 03/17/2023) for HTN and OSA.    Anabel Halon, MD

## 2022-11-16 NOTE — Assessment & Plan Note (Signed)
BP Readings from Last 1 Encounters:  11/16/22 121/73   Well-controlled with Losartan 100 mg QD, Metoprolol 25 mg QD, Nifedipine 60 mg QD and Chlorthalidone 12.5 mg QD Likely a component of OSA as well Counseled for compliance with the medications Advised DASH diet and moderate exercise/walking, at least 150 mins/week

## 2022-11-16 NOTE — Patient Instructions (Signed)
Please continue taking medications as prescribed.  Please continue to follow low salt diet and perform moderate exercise/walking at least 150 mins/week. 

## 2022-11-17 LAB — CBC WITH DIFFERENTIAL/PLATELET
Basophils Absolute: 0 10*3/uL (ref 0.0–0.2)
Basos: 0 %
EOS (ABSOLUTE): 0.3 10*3/uL (ref 0.0–0.4)
Eos: 5 %
Hematocrit: 49.5 % (ref 37.5–51.0)
Hemoglobin: 15.8 g/dL (ref 13.0–17.7)
Immature Grans (Abs): 0 10*3/uL (ref 0.0–0.1)
Immature Granulocytes: 0 %
Lymphocytes Absolute: 1.6 10*3/uL (ref 0.7–3.1)
Lymphs: 28 %
MCH: 27 pg (ref 26.6–33.0)
MCHC: 31.9 g/dL (ref 31.5–35.7)
MCV: 85 fL (ref 79–97)
Monocytes Absolute: 0.4 10*3/uL (ref 0.1–0.9)
Monocytes: 7 %
Neutrophils Absolute: 3.6 10*3/uL (ref 1.4–7.0)
Neutrophils: 60 %
Platelets: 330 10*3/uL (ref 150–450)
RBC: 5.86 x10E6/uL — ABNORMAL HIGH (ref 4.14–5.80)
RDW: 13.7 % (ref 11.6–15.4)
WBC: 5.9 10*3/uL (ref 3.4–10.8)

## 2022-11-17 LAB — CMP14+EGFR
ALT: 32 IU/L (ref 0–44)
AST: 26 IU/L (ref 0–40)
Albumin/Globulin Ratio: 1.3 (ref 1.2–2.2)
Albumin: 4.1 g/dL (ref 4.1–5.1)
Alkaline Phosphatase: 71 IU/L (ref 44–121)
BUN/Creatinine Ratio: 13 (ref 9–20)
BUN: 17 mg/dL (ref 6–24)
Bilirubin Total: 0.8 mg/dL (ref 0.0–1.2)
CO2: 31 mmol/L — ABNORMAL HIGH (ref 20–29)
Calcium: 9.1 mg/dL (ref 8.7–10.2)
Chloride: 98 mmol/L (ref 96–106)
Creatinine, Ser: 1.27 mg/dL (ref 0.76–1.27)
Globulin, Total: 3.2 g/dL (ref 1.5–4.5)
Glucose: 115 mg/dL — ABNORMAL HIGH (ref 70–99)
Potassium: 3.7 mmol/L (ref 3.5–5.2)
Sodium: 144 mmol/L (ref 134–144)
Total Protein: 7.3 g/dL (ref 6.0–8.5)
eGFR: 71 mL/min/{1.73_m2} (ref >59)

## 2022-11-17 LAB — HEMOGLOBIN A1C
Est. average glucose Bld gHb Est-mCnc: 126 mg/dL
Hgb A1c MFr Bld: 6 % — ABNORMAL HIGH (ref 4.8–5.6)

## 2022-11-17 LAB — LIPID PANEL
Chol/HDL Ratio: 5.4 ratio — ABNORMAL HIGH (ref 0.0–5.0)
Cholesterol, Total: 172 mg/dL (ref 100–199)
HDL: 32 mg/dL — ABNORMAL LOW (ref >39)
LDL Chol Calc (NIH): 116 mg/dL — ABNORMAL HIGH (ref 0–99)
Triglycerides: 135 mg/dL (ref 0–149)
VLDL Cholesterol Cal: 24 mg/dL (ref 5–40)

## 2022-11-17 LAB — VITAMIN D 25 HYDROXY (VIT D DEFICIENCY, FRACTURES): Vit D, 25-Hydroxy: 10.7 ng/mL — ABNORMAL LOW (ref 30.0–100.0)

## 2022-11-17 LAB — TSH: TSH: 1.19 u[IU]/mL (ref 0.450–4.500)

## 2022-11-19 ENCOUNTER — Telehealth: Payer: Self-pay | Admitting: Internal Medicine

## 2022-11-19 NOTE — Telephone Encounter (Signed)
FMLA   Copied Noted sleeved 

## 2022-11-20 DIAGNOSIS — Z0279 Encounter for issue of other medical certificate: Secondary | ICD-10-CM

## 2022-12-17 ENCOUNTER — Other Ambulatory Visit: Payer: Self-pay | Admitting: Internal Medicine

## 2022-12-17 DIAGNOSIS — G4733 Obstructive sleep apnea (adult) (pediatric): Secondary | ICD-10-CM

## 2023-05-06 ENCOUNTER — Encounter: Payer: Self-pay | Admitting: Internal Medicine

## 2023-05-06 NOTE — Telephone Encounter (Signed)
Called patient left voicemail to contact office to schedule in office visit. Sent mychart message also.

## 2023-05-11 ENCOUNTER — Encounter (INDEPENDENT_AMBULATORY_CARE_PROVIDER_SITE_OTHER): Payer: Self-pay | Admitting: *Deleted

## 2023-05-11 ENCOUNTER — Ambulatory Visit: Payer: No Typology Code available for payment source | Admitting: Internal Medicine

## 2023-05-11 ENCOUNTER — Encounter: Payer: Self-pay | Admitting: Internal Medicine

## 2023-05-11 VITALS — BP 134/86 | HR 93 | Ht 69.0 in | Wt 307.4 lb

## 2023-05-11 DIAGNOSIS — G4733 Obstructive sleep apnea (adult) (pediatric): Secondary | ICD-10-CM | POA: Diagnosis not present

## 2023-05-11 DIAGNOSIS — G4701 Insomnia due to medical condition: Secondary | ICD-10-CM

## 2023-05-11 DIAGNOSIS — Z1211 Encounter for screening for malignant neoplasm of colon: Secondary | ICD-10-CM

## 2023-05-11 DIAGNOSIS — I10 Essential (primary) hypertension: Secondary | ICD-10-CM

## 2023-05-11 MED ORDER — TRAZODONE HCL 50 MG PO TABS: 25.0000 mg | ORAL_TABLET | Freq: Every evening | ORAL | 3 refills | Status: DC | PRN

## 2023-05-11 NOTE — Assessment & Plan Note (Signed)
BP Readings from Last 1 Encounters:  05/11/23 (!) 142/84   Well-controlled with Losartan 100 mg QD, Metoprolol 25 mg QD, Nifedipine 60 mg QD and Chlorthalidone 25 mg every day Sent new prescription of chlorthalidone 25 mg QD Likely a component of OSA as well Counseled for compliance with the medications Advised DASH diet and moderate exercise/walking, at least 150 mins/week

## 2023-05-11 NOTE — Patient Instructions (Signed)
Please start taking Trazodone as needed for insomnia.  Please maintain simple sleep hygiene. - Maintain dark and non-noisy environment in the bedroom. - Please use the bedroom for sleep and sexual activity only. - Do not use electronic devices in the bedroom. - Please take dinner at least 2 hours before bedtime. - Please avoid caffeinated products in the evening, including coffee, soft drinks. - Please try to maintain the regular sleep-wake cycle - Go to bed and wake up at the same time.  We have ordered CPAP device for sleep apnea. Please start using CPAP regularly once you receive it.

## 2023-05-12 DIAGNOSIS — G4701 Insomnia due to medical condition: Secondary | ICD-10-CM | POA: Insufficient documentation

## 2023-05-12 NOTE — Progress Notes (Signed)
Established Patient Office Visit  Subjective:  Patient ID: Adam Crosby, male    DOB: 1976/03/19  Age: 47 y.o. MRN: [id number]  CC:  Chief Complaint  Patient presents with   Insomnia    Patient is not sleeping     HPI Adam Crosby is a 46 y.o. male with past medical history of HTN, OSA, and morbid obesity who presents for f/u of his chronic medical conditions.  HTN: BP is well-controlled. Takes medications regularly.  Of note, he has been taking chlorthalidone 1 tablet once daily instead of half tablet.  Patient denies headache, dizziness, chest pain, dyspnea or palpitations.  OSA: He reports difficulty maintaining sleep more than 2 hours at a time.  He had sleep study done, which showed severe sleep apnea.  He could not start CPAP treatment as he had insurance coverage gap.  He has daytime fatigue and drowsiness due to lack of sleep at nighttime.    Past Medical History:  Diagnosis Date   Acute right-sided low back pain with right-sided sciatica 05/11/2022   Allergy    Arthritis    Encounter for general adult medical examination with abnormal findings 11/16/2022   GERD (gastroesophageal reflux disease) 10/09/2016   Hypertension    Lumbar vertebral fracture (HCC) 01/30/2018   OSA (obstructive sleep apnea) 10/22/2016    History reviewed. No pertinent surgical history.  Family History  Problem Relation Age of Onset   Arthritis Mother    Hyperlipidemia Mother    Hypertension Mother    Varicose Veins Mother    Diabetes Daughter    Arthritis Maternal Grandmother    Cancer Maternal Grandmother    Diabetes Maternal Grandmother    Varicose Veins Maternal Grandmother    Arthritis Maternal Grandfather    Cancer Maternal Grandfather    Hearing loss Maternal Grandfather     Social History   Socioeconomic History   Marital status: Divorced    Spouse name: Not on file   Number of children: Not on file   Years of education: Not on file   Highest education level: GED  or equivalent  Occupational History   Not on file  Tobacco Use   Smoking status: Never   Smokeless tobacco: Never  Substance and Sexual Activity   Alcohol use: Yes    Alcohol/week: 1.0 standard drink of alcohol    Types: 1 Cans of beer per week    Comment: EVERYOTHER MONTH   Drug use: No   Sexual activity: Yes  Other Topics Concern   Not on file  Social History Narrative   Not on file   Social Determinants of Health   Financial Resource Strain: High Risk (05/10/2023)   Overall Financial Resource Strain (CARDIA)    Difficulty of Paying Living Expenses: Hard  Food Insecurity: Food Insecurity Present (05/10/2023)   Hunger Vital Sign    Worried About Running Out of Food in the Last Year: Sometimes true    Ran Out of Food in the Last Year: Sometimes true  Transportation Needs: No Transportation Needs (05/10/2023)   PRAPARE - Administrator, Civil Service (Medical): No    Lack of Transportation (Non-Medical): No  Physical Activity: Sufficiently Active (05/10/2023)   Exercise Vital Sign    Days of Exercise per Week: 5 days    Minutes of Exercise per Session: 30 min  Stress: Stress Concern Present (05/10/2023)   [employer] of Occupational Health - Occupational Stress Questionnaire    Feeling of Stress : Very  much  Social Connections: Moderately Isolated (05/10/2023)   Social Connection and Isolation Panel [NHANES]    Frequency of Communication with Friends and Family: More than three times a week    Frequency of Social Gatherings with Friends and Family: More than three times a week    Attends Religious Services: 1 to 4 times per year    Active Member of [employer] or Organizations: No    Attends Engineer, structural: Not on file    Marital Status: Divorced  Catering manager Violence: Not on file    Outpatient Medications Prior to Visit  Medication Sig Dispense Refill   albuterol (VENTOLIN HFA) 108 (90 Base) MCG/ACT inhaler Inhale 2 puffs into the lungs every 4  (four) hours as needed for wheezing or shortness of breath. 18 g 0   chlorthalidone (HYGROTON) 25 MG tablet Take 0.5 tablets (12.5 mg total) by mouth daily. 45 tablet 1   lidocaine (LIDODERM) 5 % Place 1 patch onto the skin daily. Remove & Discard patch within 12 hours or as directed by MD 5 patch 0   losartan (COZAAR) 100 MG tablet TAKE 1 TABLET(100 MG) BY MOUTH DAILY 30 tablet 3   methocarbamol (ROBAXIN) 500 MG tablet Take 1 tablet (500 mg total) by mouth every 8 (eight) hours as needed for muscle spasms. 30 tablet 0   metoprolol succinate (TOPROL-XL) 25 MG 24 hr tablet TAKE 1 TABLET(25 MG) BY MOUTH DAILY 30 tablet 3   naproxen (NAPROSYN) 500 MG tablet TAKE 1 TABLET(500 MG) BY MOUTH TWICE DAILY WITH A MEAL 30 tablet 0   NIFEdipine (PROCARDIA XL/NIFEDICAL XL) 60 MG 24 hr tablet Take 1 tablet (60 mg total) by mouth daily. 30 tablet 3   sildenafil (VIAGRA) 50 MG tablet Take 1 tablet (50 mg total) by mouth daily as needed for erectile dysfunction. 10 tablet 2   No facility-administered medications prior to visit.    No Known Allergies  ROS Review of Systems  Constitutional:  Negative for chills and fever.  HENT:  Negative for congestion and sore throat.   Eyes:  Negative for pain and discharge.  Respiratory:  Negative for cough and shortness of breath.   Cardiovascular:  Positive for leg swelling. Negative for chest pain and palpitations.  Gastrointestinal:  Negative for diarrhea, nausea and vomiting.  Endocrine: Negative for polydipsia and polyuria.  Genitourinary:  Negative for dysuria and hematuria.  Musculoskeletal:  Positive for back pain. Negative for neck pain and neck stiffness.  Skin:  Negative for rash.  Neurological:  Negative for dizziness, weakness, numbness and headaches.  Psychiatric/Behavioral:  Positive for sleep disturbance. Negative for agitation and behavioral problems.       Objective:    Physical Exam Vitals reviewed.  Constitutional:      General: He is not  in acute distress.    Appearance: He is obese. He is not diaphoretic.  HENT:     Head: Normocephalic and atraumatic.     Nose: Nose normal.     Mouth/Throat:     Mouth: Mucous membranes are moist.  Eyes:     General: No scleral icterus.    Extraocular Movements: Extraocular movements intact.  Cardiovascular:     Rate and Rhythm: Normal rate and regular rhythm.     Pulses: Normal pulses.     Heart sounds: Normal heart sounds. No murmur heard. Pulmonary:     Breath sounds: Normal breath sounds. No wheezing or rales.  Musculoskeletal:     Cervical back: Neck supple.  Right lower leg: Edema (1+) present.     Left lower leg: Edema (1+) present.  Skin:    General: Skin is warm.     Findings: No rash.  Neurological:     General: No focal deficit present.     Mental Status: He is alert and oriented to person, place, and time.     Cranial Nerves: No cranial nerve deficit.     Sensory: No sensory deficit.     Motor: No weakness.  Psychiatric:        Mood and Affect: Mood normal.        Behavior: Behavior normal.     BP 134/86 (BP Location: Right Arm)   Pulse 93   Ht 5\' 9"  (1.753 m)   Wt (!) 307 lb 6.4 oz (139.4 kg)   SpO2 94%   BMI 45.40 kg/m  Wt Readings from Last 3 Encounters:  05/11/23 (!) 307 lb 6.4 oz (139.4 kg)  11/16/22 (!) 308 lb (139.7 kg)  10/23/22 (!) 310 lb 3.2 oz (140.7 kg)    Lab Results  Component Value Date   TSH 1.190 11/16/2022   Lab Results  Component Value Date   WBC 5.9 11/16/2022   HGB 15.8 11/16/2022   HCT 49.5 11/16/2022   MCV 85 11/16/2022   PLT 330 11/16/2022   Lab Results  Component Value Date   NA 144 11/16/2022   K 3.7 11/16/2022   CO2 31 (H) 11/16/2022   GLUCOSE 115 (H) 11/16/2022   BUN 17 11/16/2022   CREATININE 1.27 11/16/2022   BILITOT 0.8 11/16/2022   ALKPHOS 71 11/16/2022   AST 26 11/16/2022   ALT 32 11/16/2022   PROT 7.3 11/16/2022   ALBUMIN 4.1 11/16/2022   CALCIUM 9.1 11/16/2022   ANIONGAP 9 05/19/2020   EGFR  71 11/16/2022   Lab Results  Component Value Date   CHOL 172 11/16/2022   Lab Results  Component Value Date   HDL 32 (L) 11/16/2022   Lab Results  Component Value Date   LDLCALC 116 (H) 11/16/2022   Lab Results  Component Value Date   TRIG 135 11/16/2022   Lab Results  Component Value Date   CHOLHDL 5.4 (H) 11/16/2022   Lab Results  Component Value Date   HGBA1C 6.0 (H) 11/16/2022      Assessment & Plan:   Problem List Items Addressed This Visit       Cardiovascular and Mediastinum   Essential hypertension    BP Readings from Last 1 Encounters:  05/11/23 (!) 142/84  Well-controlled with Losartan 100 mg QD, Metoprolol 25 mg QD, Nifedipine 60 mg QD and Chlorthalidone 25 mg every day Sent new prescription of chlorthalidone 25 mg QD Likely a component of OSA as well Counseled for compliance with the medications Advised DASH diet and moderate exercise/walking, at least 150 mins/week        Respiratory   OSA (obstructive sleep apnea) - Primary    Snoring, chronic fatigue, apneic episodes and uncontrolled HTN in association with morbid obesity STOP-BANG: 7 Home sleep study showed severe OSA Needs CPAP, ordered again      Relevant Orders   For home use only DME continuous positive airway pressure (CPAP)     Other   Morbid obesity (HCC)    Diet modification and moderate exercise/walking at least 150 mins/week DASH diet material provided      Insomnia due to medical condition    His insomnia is mostly due to OSA, but underlying  anxiety also leading to insomnia Sleep hygiene discussed, material provided Started trazodone as needed      Relevant Medications   traZODone (DESYREL) 50 MG tablet   Other Visit Diagnoses     Colon cancer screening       Relevant Orders   Ambulatory referral to Gastroenterology       Meds ordered this encounter  Medications   traZODone (DESYREL) 50 MG tablet    Sig: Take 0.5-1 tablets (25-50 mg total) by mouth at  bedtime as needed for sleep.    Dispense:  30 tablet    Refill:  3    Follow-up: Return in about 3 months (around 08/11/2023) for HTN and OSA.    Anabel Halon, MD

## 2023-05-12 NOTE — Assessment & Plan Note (Signed)
Snoring, chronic fatigue, apneic episodes and uncontrolled HTN in association with morbid obesity STOP-BANG: 7 Home sleep study showed severe OSA Needs CPAP, ordered again

## 2023-05-12 NOTE — Assessment & Plan Note (Signed)
His insomnia is mostly due to OSA, but underlying anxiety also leading to insomnia Sleep hygiene discussed, material provided Started trazodone as needed

## 2023-05-12 NOTE — Assessment & Plan Note (Signed)
Diet modification and moderate exercise/walking at least 150 mins/week DASH diet material provided 

## 2023-05-18 ENCOUNTER — Other Ambulatory Visit: Payer: Self-pay | Admitting: Internal Medicine

## 2023-05-18 DIAGNOSIS — I10 Essential (primary) hypertension: Secondary | ICD-10-CM

## 2023-05-18 DIAGNOSIS — G8929 Other chronic pain: Secondary | ICD-10-CM

## 2023-06-22 ENCOUNTER — Encounter (INDEPENDENT_AMBULATORY_CARE_PROVIDER_SITE_OTHER): Payer: Self-pay | Admitting: Gastroenterology

## 2023-06-29 ENCOUNTER — Other Ambulatory Visit: Payer: Self-pay | Admitting: Internal Medicine

## 2023-06-29 DIAGNOSIS — G8929 Other chronic pain: Secondary | ICD-10-CM

## 2023-07-02 ENCOUNTER — Other Ambulatory Visit: Payer: Self-pay

## 2023-07-02 ENCOUNTER — Ambulatory Visit
Admission: RE | Admit: 2023-07-02 | Discharge: 2023-07-02 | Disposition: A | Discharge status: Home / Self Care | Payer: No Typology Code available for payment source | Source: Ambulatory Visit | Attending: Family Medicine

## 2023-07-02 VITALS — BP 120/73 | HR 98 | Temp 98.5°F | Resp 18

## 2023-07-02 DIAGNOSIS — R52 Pain, unspecified: Secondary | ICD-10-CM

## 2023-07-02 DIAGNOSIS — R110 Nausea: Secondary | ICD-10-CM

## 2023-07-02 DIAGNOSIS — G4700 Insomnia, unspecified: Secondary | ICD-10-CM | POA: Diagnosis not present

## 2023-07-02 DIAGNOSIS — R42 Dizziness and giddiness: Secondary | ICD-10-CM

## 2023-07-02 DIAGNOSIS — R519 Headache, unspecified: Secondary | ICD-10-CM | POA: Diagnosis not present

## 2023-07-02 LAB — POCT FASTING CBG KUC MANUAL ENTRY: POCT Glucose (KUC): 147 mg/dL — AB (ref 70–99)

## 2023-07-02 MED ORDER — TIZANIDINE HCL 4 MG PO CAPS: 4.0000 mg | ORAL_CAPSULE | Freq: Three times a day (TID) | ORAL | 0 refills | Status: DC | PRN

## 2023-07-02 MED ORDER — HYDROXYZINE HCL 25 MG PO TABS: 25.0000 mg | ORAL_TABLET | Freq: Every evening | ORAL | 0 refills | Status: DC | PRN

## 2023-07-02 NOTE — ED Triage Notes (Signed)
Pt reports he has been having headaches, blurred vision, nauseas, body aches x 3 weeks.

## 2023-07-02 NOTE — Discharge Instructions (Signed)
Make sure to stay well-hydrated, both with plain water and electrolyte solutions.  I have sent over a muscle relaxer to help with your muscle stiffness and aching and a medication to help with sleep and anxiousness.  Follow-up with your primary care provider soon as possible.

## 2023-07-03 LAB — CBC WITH DIFFERENTIAL/PLATELET
Basophils Absolute: 0 10*3/uL (ref 0.0–0.2)
Basos: 0 %
EOS (ABSOLUTE): 0.2 10*3/uL (ref 0.0–0.4)
Eos: 3 %
Hematocrit: 48.4 % (ref 37.5–51.0)
Hemoglobin: 15.3 g/dL (ref 13.0–17.7)
Immature Grans (Abs): 0 10*3/uL (ref 0.0–0.1)
Immature Granulocytes: 0 %
Lymphocytes Absolute: 1.6 10*3/uL (ref 0.7–3.1)
Lymphs: 25 %
MCH: 27 pg (ref 26.6–33.0)
MCHC: 31.6 g/dL (ref 31.5–35.7)
MCV: 86 fL (ref 79–97)
Monocytes Absolute: 0.4 10*3/uL (ref 0.1–0.9)
Monocytes: 7 %
Neutrophils Absolute: 4.1 10*3/uL (ref 1.4–7.0)
Neutrophils: 65 %
Platelets: 306 10*3/uL (ref 150–450)
RBC: 5.66 x10E6/uL (ref 4.14–5.80)
RDW: 13.7 % (ref 11.6–15.4)
WBC: 6.4 10*3/uL (ref 3.4–10.8)

## 2023-07-03 LAB — COMPREHENSIVE METABOLIC PANEL
ALT: 32 IU/L (ref 0–44)
AST: 20 IU/L (ref 0–40)
Albumin: 4.1 g/dL (ref 4.1–5.1)
Alkaline Phosphatase: 70 IU/L (ref 44–121)
BUN/Creatinine Ratio: 15 (ref 9–20)
BUN: 25 mg/dL — ABNORMAL HIGH (ref 6–24)
Bilirubin Total: 0.5 mg/dL (ref 0.0–1.2)
CO2: 29 mmol/L (ref 20–29)
Calcium: 9.2 mg/dL (ref 8.7–10.2)
Chloride: 97 mmol/L (ref 96–106)
Creatinine, Ser: 1.63 mg/dL — ABNORMAL HIGH (ref 0.76–1.27)
Globulin, Total: 2.8 g/dL (ref 1.5–4.5)
Glucose: 135 mg/dL — ABNORMAL HIGH (ref 70–99)
Potassium: 3.6 mmol/L (ref 3.5–5.2)
Sodium: 144 mmol/L (ref 134–144)
Total Protein: 6.9 g/dL (ref 6.0–8.5)
eGFR: 53 mL/min/{1.73_m2} — ABNORMAL LOW (ref >59)

## 2023-07-04 NOTE — ED Provider Notes (Signed)
[facility]    CSN: [id number] Arrival date & time: 07/02/23  1335      History   Chief Complaint Chief Complaint  Patient presents with   Headache    And body hurting - Entered by patient    HPI Adam Crosby is a 47 y.o. male.   Patient presenting today with about 3 weeks of intermittent episodes of headaches, fuzzy vision, nausea, aching muscles, occasional lightheadedness.  Denies fever, chest pain, shortness of breath, palpitations, abdominal pain, diarrhea, constipation.  Notes that symptoms tend to happen when he is at work and feeling like he might be overheating.  States he drinks a lot of soda, not a lot of water.  He is also under significant stress lately and not sleeping well.  Feels very anxious most of the time.  Denies any known neurologic conditions.  History pertinent for hypertension, OSA.  So far not tried anything over-the-counter for symptoms.    Past Medical History:  Diagnosis Date   Acute right-sided low back pain with right-sided sciatica 05/11/2022   Allergy    Arthritis    Encounter for general adult medical examination with abnormal findings 11/16/2022   GERD (gastroesophageal reflux disease) 10/09/2016   Hypertension    Lumbar vertebral fracture (HCC) 01/30/2018   OSA (obstructive sleep apnea) 10/22/2016    Patient Active Problem List   Diagnosis Date Noted   Insomnia due to medical condition 05/12/2023   Encounter for general adult medical examination with abnormal findings 11/16/2022   Adjustment disorder with depressed mood 11/16/2022   Erectile dysfunction 11/16/2022   Urethral discharge 10/23/2022   History of compression fracture of spine 06/15/2022   Acute right-sided low back pain with right-sided sciatica 05/11/2022   Encounter for examination following treatment at hospital 05/11/2022   Nocturia 03/25/2021   Compression fracture of L1 lumbar vertebra (HCC) 01/31/2018   Vitamin D deficiency 10/26/2016   Morbid obesity  (HCC) 10/22/2016   OSA (obstructive sleep apnea) 10/22/2016   GERD (gastroesophageal reflux disease) 10/09/2016   Essential hypertension 09/23/2016    History reviewed. No pertinent surgical history.     Home Medications    Prior to Admission medications   Medication Sig Start Date End Date Taking? Authorizing Provider  hydrOXYzine (ATARAX) 25 MG tablet Take 1 tablet (25 mg total) by mouth at bedtime as needed. 07/02/23  Yes Particia Nearing, PA-C  tiZANidine (ZANAFLEX) 4 MG capsule Take 1 capsule (4 mg total) by mouth 3 (three) times daily as needed for muscle spasms. Do not drink alcohol or drive while taking this medication.  May cause drowsiness. 07/02/23  Yes Particia Nearing, PA-C  albuterol (VENTOLIN HFA) 108 (90 Base) MCG/ACT inhaler Inhale 2 puffs into the lungs every 4 (four) hours as needed for wheezing or shortness of breath. 04/15/21   Rodriguez-Southworth, Nettie Elm, PA-C  chlorthalidone (HYGROTON) 25 MG tablet TAKE 1/2 TABLET(12.5 MG) BY MOUTH DAILY 05/18/23   Anabel Halon, MD  lidocaine (LIDODERM) 5 % Place 1 patch onto the skin daily. Remove & Discard patch within 12 hours or as directed by MD 05/01/22   Sabas Sous, MD  losartan (COZAAR) 100 MG tablet TAKE 1 TABLET(100 MG) BY MOUTH DAILY 11/03/22   Anabel Halon, MD  methocarbamol (ROBAXIN) 500 MG tablet Take 1 tablet (500 mg total) by mouth every 8 (eight) hours as needed for muscle spasms. 05/01/22   Sabas Sous, MD  metoprolol succinate (TOPROL-XL) 25 MG 24 hr tablet TAKE  1 TABLET(25 MG) BY MOUTH DAILY 11/03/22   Anabel Halon, MD  naproxen (NAPROSYN) 500 MG tablet TAKE 1 TABLET(500 MG) BY MOUTH TWICE DAILY WITH A MEAL 06/29/23   Anabel Halon, MD  NIFEdipine (PROCARDIA XL/NIFEDICAL XL) 60 MG 24 hr tablet Take 1 tablet (60 mg total) by mouth daily. 11/16/22   Anabel Halon, MD  sildenafil (VIAGRA) 50 MG tablet Take 1 tablet (50 mg total) by mouth daily as needed for erectile dysfunction. 11/16/22   Anabel Halon, MD  traZODone (DESYREL) 50 MG tablet Take 0.5-1 tablets (25-50 mg total) by mouth at bedtime as needed for sleep. 05/11/23   Anabel Halon, MD  hydrochlorothiazide (HYDRODIURIL) 25 MG tablet Take 1 tablet (25 mg total) by mouth daily. Patient not taking: Reported on 01/30/2018 09/23/16 05/19/20  Allayne Butcher B, PA-C  omeprazole (PRILOSEC) 20 MG capsule TAKE ONE CAPSULE BY MOUTH ONCE DAILY Patient not taking: Reported on 01/30/2018 02/25/17 05/19/20  Salley Scarlet, MD    Family History Family History  Problem Relation Age of Onset   Arthritis Mother    Hyperlipidemia Mother    Hypertension Mother    Varicose Veins Mother    Diabetes Daughter    Arthritis Maternal Grandmother    Cancer Maternal Grandmother    Diabetes Maternal Grandmother    Varicose Veins Maternal Grandmother    Arthritis Maternal Grandfather    Cancer Maternal Grandfather    Hearing loss Maternal Grandfather     Social History Social History   Tobacco Use   Smoking status: Never   Smokeless tobacco: Never  Substance Use Topics   Alcohol use: Yes    Alcohol/week: 1.0 standard drink of alcohol    Types: 1 Cans of beer per week    Comment: EVERYOTHER MONTH   Drug use: No     Allergies   Patient has no known allergies.   Review of Systems Review of Systems Per HPI  Physical Exam Triage Vital Signs ED Triage Vitals  Encounter Vitals Group     BP 07/02/23 1401 120/73     Systolic BP Percentile --      Diastolic BP Percentile --      Pulse Rate 07/02/23 1401 98     Resp 07/02/23 1401 18     Temp 07/02/23 1401 98.5 F (36.9 C)     Temp Source 07/02/23 1401 Oral     SpO2 07/02/23 1401 91 %     Weight --      Height --      Head Circumference --      Peak Flow --      Pain Score 07/02/23 1403 0     Pain Loc --      Pain Education --      Exclude from Growth Chart --    No data found.  Updated Vital Signs BP 120/73 (BP Location: Right Arm)   Pulse 98   Temp 98.5 F (36.9 C)  (Oral)   Resp 18   SpO2 91%   Visual Acuity Right Eye Distance:   Left Eye Distance:   Bilateral Distance:    Right Eye Near:   Left Eye Near:    Bilateral Near:     Physical Exam Vitals and nursing note reviewed.  Constitutional:      Appearance: Normal appearance.  HENT:     Head: Atraumatic.     Mouth/Throat:     Mouth: Mucous membranes are moist.  Eyes:  Extraocular Movements: Extraocular movements intact.     Conjunctiva/sclera: Conjunctivae normal.     Pupils: Pupils are equal, round, and reactive to light.  Cardiovascular:     Rate and Rhythm: Normal rate and regular rhythm.  Pulmonary:     Effort: Pulmonary effort is normal.     Breath sounds: Normal breath sounds.  Abdominal:     General: Bowel sounds are normal. There is no distension.     Palpations: Abdomen is soft.     Tenderness: There is no abdominal tenderness.  Musculoskeletal:        General: Normal range of motion.     Cervical back: Normal range of motion and neck supple.  Skin:    General: Skin is warm and dry.  Neurological:     General: No focal deficit present.     Mental Status: He is oriented to person, place, and time.     Cranial Nerves: No cranial nerve deficit.     Motor: No weakness.     Gait: Gait normal.  Psychiatric:        Mood and Affect: Mood normal.        Thought Content: Thought content normal.        Judgment: Judgment normal.      UC Treatments / Results  Labs (all labs ordered are listed, but only abnormal results are displayed) Labs Reviewed  COMPREHENSIVE METABOLIC PANEL - Abnormal; Notable for the following components:      Result Value   Glucose 135 (*)    BUN 25 (*)    Creatinine, Ser 1.63 (*)    eGFR 53 (*)    All other components within normal limits   Narrative:    Performed at:  [address] [facility] [address] [place], [place]  [id number] Lab Director: Jolene Schimke MD, Phone:  [phone]  POCT FASTING [facility] MANUAL ENTRY - Abnormal;  Notable for the following components:   POCT Glucose ([facility]) 147 (*)    All other components within normal limits  CBC WITH DIFFERENTIAL/PLATELET   Narrative:    Performed at:  [address] [facility] [address] [place], [place]  [id number] Lab Director: Jolene Schimke MD, Phone:  [phone]    EKG   Radiology No results found.  Procedures Procedures (including critical care time)  Medications Ordered in UC Medications - No data to display  Initial Impression / Assessment and Plan / UC Course  I have reviewed the triage vital signs and the nursing notes.  Pertinent labs & imaging results that were available during my care of the patient were reviewed by me and considered in my medical decision making (see chart for details).     Patient presenting with several week history of intermittent episodic vague symptoms.  So far today vital signs, exam and point-of-care glucose fairly reassuring.  Glucose slightly elevated at 147 but this is a random reading, nonfasting.  Do not suspect this is elevated enough to be significantly symptomatic at this time.  EKG today showing normal sinus rhythm at 90 bpm with no acute ST or T wave changes.  He notes he is under significant distress, sleeping poorly and not eating or drinking well.  Trial hydroxyzine to help with sleep and anxiety, increase hydration and Zanaflex for muscle and bodyaches likely secondary to physical nature of his work.  Labs pending for further evaluation.  Follow-up with PCP for recheck. Final Clinical Impressions(s) / UC Diagnoses   Final diagnoses:  Dizziness  Acute nonintractable headache, unspecified headache type  Body aches  Nausea without vomiting  Insomnia, unspecified type     Discharge Instructions      Make sure to stay well-hydrated, both with plain water and electrolyte solutions.  I have sent over a muscle relaxer to help with your muscle stiffness and aching and a medication to help with sleep and  anxiousness.  Follow-up with your primary care provider soon as possible.    ED Prescriptions     Medication Sig Dispense Auth. Provider   hydrOXYzine (ATARAX) 25 MG tablet Take 1 tablet (25 mg total) by mouth at bedtime as needed. 20 tablet Particia Nearing, [place]   tiZANidine (ZANAFLEX) 4 MG capsule Take 1 capsule (4 mg total) by mouth 3 (three) times daily as needed for muscle spasms. Do not drink alcohol or drive while taking this medication.  May cause drowsiness. 15 capsule Particia Nearing, [place]      PDMP not reviewed this encounter.   Particia Nearing, [place] 07/04/23 1500

## 2023-07-05 ENCOUNTER — Ambulatory Visit (INDEPENDENT_AMBULATORY_CARE_PROVIDER_SITE_OTHER): Payer: No Typology Code available for payment source | Admitting: Gastroenterology

## 2023-07-06 ENCOUNTER — Encounter (INDEPENDENT_AMBULATORY_CARE_PROVIDER_SITE_OTHER): Payer: Self-pay | Admitting: Gastroenterology

## 2023-07-06 ENCOUNTER — Ambulatory Visit (INDEPENDENT_AMBULATORY_CARE_PROVIDER_SITE_OTHER): Payer: No Typology Code available for payment source | Admitting: Gastroenterology

## 2023-07-06 VITALS — BP 126/85 | HR 89 | Temp 98.2°F | Ht 69.0 in | Wt 302.2 lb

## 2023-07-06 DIAGNOSIS — Z6841 Body Mass Index (BMI) 40.0 and over, adult: Secondary | ICD-10-CM | POA: Diagnosis not present

## 2023-07-06 DIAGNOSIS — R7989 Other specified abnormal findings of blood chemistry: Secondary | ICD-10-CM

## 2023-07-06 DIAGNOSIS — E669 Obesity, unspecified: Secondary | ICD-10-CM

## 2023-07-06 DIAGNOSIS — K909 Intestinal malabsorption, unspecified: Secondary | ICD-10-CM | POA: Diagnosis not present

## 2023-07-06 DIAGNOSIS — K921 Melena: Secondary | ICD-10-CM

## 2023-07-06 DIAGNOSIS — R197 Diarrhea, unspecified: Secondary | ICD-10-CM | POA: Insufficient documentation

## 2023-07-06 MED ORDER — PEG 3350-KCL-NA BICARB-NACL 420 G PO SOLR: 4000.0000 mL | Freq: Once | ORAL | 0 refills | Status: AC

## 2023-07-06 MED ORDER — PSYLLIUM 58.6 % PO PACK: 1.0000 | PACK | Freq: Two times a day (BID) | ORAL | 2 refills | Status: AC

## 2023-07-06 NOTE — Progress Notes (Signed)
Vista Lawman , M.D. Gastroenterology & Hepatology [facility] G[facility]enter[facility] [address] [place], [place] [address] Primary Care Physician: Anabel Halon, MD [address] [place] [address]  Chief Complaint: Colon cancer screening, chronic diarrhea and hematochezia  History of Present Illness: Adam Crosby is a 47 y.o. male with hypertension, OSA and class III obesity who presents for evaluation of Colon cancer screening, chronic diarrhea and painless hematochezia.  He reports that for many he suffers from diarrhea.  He reports that every time he eats something 15-73min  after he would have a liquid bowel movement and this would happen 4-5 times a day.  The only thing which controls his diarrhea is not eating or eating a lot of bread.  For past few months he has also noticed fresh blood upon wiping even though he denies any constipation or straining.  Patient reports that he has stopped drinking soda and eating fast food and appears to have lost 15 pounds since that time  The patient denies having any nausea, vomiting, fever, chills, melena, hematemesis, abdominal distention, abdominal pain,  jaundice, pruritus or weight loss.  Last WGN:FAOZ Last Colonoscopy:None  FHx: neg for any gastrointestinal/liver disease, no malignancies Social: neg smoking, alcohol or illicit drug use Surgical: no abdominal surgeries  Past Medical History: Past Medical History:  Diagnosis Date   Acute right-sided low back pain with right-sided sciatica 05/11/2022   Allergy    Arthritis    Encounter for general adult medical examination with abnormal findings 11/16/2022   GERD (gastroesophageal reflux disease) 10/09/2016   Hypertension    Lumbar vertebral fracture (HCC) 01/30/2018   OSA (obstructive sleep apnea) 10/22/2016    Past Surgical History:History reviewed. No pertinent surgical history.  Family History: Family History  Problem Relation Age of  Onset   Arthritis Mother    Hyperlipidemia Mother    Hypertension Mother    Varicose Veins Mother    Diabetes Daughter    Arthritis Maternal Grandmother    Cancer Maternal Grandmother    Diabetes Maternal Grandmother    Varicose Veins Maternal Grandmother    Arthritis Maternal Grandfather    Cancer Maternal Grandfather    Hearing loss Maternal Grandfather     Social History: Social History   Tobacco Use  Smoking Status Never  Smokeless Tobacco Never   Social History   Substance and Sexual Activity  Alcohol Use Yes   Alcohol/week: 1.0 standard drink of alcohol   Types: 1 Cans of beer per week   Comment: EVERYOTHER MONTH   Social History   Substance and Sexual Activity  Drug Use No    Allergies: No Known Allergies  Medications: Current Outpatient Medications  Medication Sig Dispense Refill   albuterol (VENTOLIN HFA) 108 (90 Base) MCG/ACT inhaler Inhale 2 puffs into the lungs every 4 (four) hours as needed for wheezing or shortness of breath. 18 g 0   chlorthalidone (HYGROTON) 25 MG tablet TAKE 1/2 TABLET(12.5 MG) BY MOUTH DAILY 45 tablet 1   hydrOXYzine (ATARAX) 25 MG tablet Take 1 tablet (25 mg total) by mouth at bedtime as needed. 20 tablet 0   losartan (COZAAR) 100 MG tablet TAKE 1 TABLET(100 MG) BY MOUTH DAILY 30 tablet 3   methocarbamol (ROBAXIN) 500 MG tablet Take 1 tablet (500 mg total) by mouth every 8 (eight) hours as needed for muscle spasms. 30 tablet 0   metoprolol succinate (TOPROL-XL) 25 MG 24 hr tablet TAKE 1 TABLET(25 MG) BY MOUTH DAILY  30 tablet 3   naproxen (NAPROSYN) 500 MG tablet TAKE 1 TABLET(500 MG) BY MOUTH TWICE DAILY WITH A MEAL 30 tablet 0   NIFEdipine (PROCARDIA XL/NIFEDICAL XL) 60 MG 24 hr tablet Take 1 tablet (60 mg total) by mouth daily. 30 tablet 3   psyllium (METAMUCIL) 58.6 % packet Take 1 packet by mouth 2 (two) times daily. 60 packet 2   sildenafil (VIAGRA) 50 MG tablet Take 1 tablet (50 mg total) by mouth daily as needed for  erectile dysfunction. 10 tablet 2   tiZANidine (ZANAFLEX) 4 MG capsule Take 1 capsule (4 mg total) by mouth 3 (three) times daily as needed for muscle spasms. Do not drink alcohol or drive while taking this medication.  May cause drowsiness. 15 capsule 0   traZODone (DESYREL) 50 MG tablet Take 0.5-1 tablets (25-50 mg total) by mouth at bedtime as needed for sleep. (Patient not taking: Reported on 07/06/2023) 30 tablet 3   No current facility-administered medications for this visit.    Review of Systems: GENERAL: negative for malaise, night sweats HEENT: No changes in hearing or vision, no nose bleeds or other nasal problems. NECK: Negative for lumps, goiter, pain and significant neck swelling RESPIRATORY: Negative for cough, wheezing CARDIOVASCULAR: Negative for chest pain, leg swelling, palpitations, orthopnea GI: SEE HPI MUSCULOSKELETAL: Negative for joint pain or swelling, back pain, and muscle pain. SKIN: Negative for lesions, rash HEMATOLOGY Negative for prolonged bleeding, bruising easily, and swollen nodes. ENDOCRINE: Negative for cold or heat intolerance, polyuria, polydipsia and goiter. NEURO: negative for tremor, gait imbalance, syncope and seizures. The remainder of the review of systems is noncontributory.   Physical Exam: BP 126/85 (BP Location: Left Arm, Patient Position: Sitting, Cuff Size: Large)   Pulse 89   Temp 98.2 F (36.8 C) (Oral)   Ht 5\' 9"  (1.753 m)   Wt (!) 302 lb 3.2 oz (137.1 kg)   BMI 44.63 kg/m  GENERAL: The patient is AO x3, in no acute distress. HEENT: Head is normocephalic and atraumatic. EOMI are intact. Mouth is well hydrated and without lesions. NECK: Supple. No masses LUNGS: Clear to auscultation. No presence of rhonchi/wheezing/rales. Adequate chest expansion HEART: RRR, normal s1 and s2. ABDOMEN: Soft, nontender, no guarding, no peritoneal signs, and nondistended. BS +. No masses.  EXTREMITIES: Without any cyanosis, clubbing, rash, lesions  or edema. NEUROLOGIC: AOx3, no focal motor deficit. SKIN: no jaundice, no rashes   Imaging/Labs: as above  I personally reviewed and interpreted the available labs, imaging and endoscopic files. Hemoglobin 15.3 platelet 306 hemoglobin A1c 6.0.  Normal liver enzymes AST 20 ALT 32 alk phos 70 T. bili 1.5 Worsening kidney function with creatinine 1.63   Impression and Plan: KEELON BUEHL is a 47 y.o. male with hypertension, OSA and class III obesity who presents for evaluation of Colon cancer screening, chronic diarrhea and painless hematochezia.  #Painless hematochezia #Chronic Diarrhea  Patient with chronic diarrhea and with recent painless hematochezia need to rule out inflammatory bowel disease and celiac disease. Does not have much constipation or straining and is unlikely hemorrhoidal bleed  At this time colonoscopy with TI intubation is indicated  Will also order celiac panel, fecal calprotectin and CRP.  Ensure adequate fluid intake: Aim for 8 glasses of water daily. Follow a high fiber diet: Include foods such as dates, prunes, pears, and kiwi. Use Metamucil twice a day.   #Worsening Creatinine  Recent blood work done at urgent care worsening kidney function with creatinine 1.63.  His  blood pressure is well-controlled and does not explain worsening creatinine and hemoglobin A1c is 6.0.  Patient advised to follow-up with PCP and nephrologist  #BMI 45  Patient has class III obesity with comorbidities with hypertension, OSA and prediabetes.  He is motivated and has lost at least 15 pounds in past few months with lifestyle modification.  Although goal of losing 100 pounds with lifestyle modification is unlikely and further intervention is indicated,Given young age with comorbid conditions  Recs:     - walking at a brisk pace/biking at moderate intesity 2.5-5 hours per week     - use pedometer/step counter to track activity     - goal to lose 5-10% of initial body  weight     - avoid suagry drinks and juices, use zero calorie beverages     - increase water intake     - eat a low carb diet with plenty of veggies and fruit     - Get sufficient sleep 7-8 hrs nightly     - maitain active lifestyle     - avoid alcohol     - recommend 2-3 cups Coffee daily     - Counsel on lowering cholesterol by having a diet rich in vegetables,          protein (avoid red meats) and good fats(fish, salmon). - Consider semaglutide as above. Other medications that may be used include orlistat, Qsymia, Contrave - Can also consider endoscopic bariatric surgery    All questions were answered.      Vista Lawman, MD Gastroenterology and Hepatology [facility] Gastroenterology   This chart has been completed using [facility] Dictation software, and while attempts have been made to ensure accuracy , certain words and phrases may not be transcribed as intended

## 2023-07-06 NOTE — Patient Instructions (Signed)
It was very nice to meet you today, as dicussed with will plan for the following :  1) Colonoscopy 2) Labwork and stool studies 3)  Weight loss:      - walking at a brisk pace/biking at moderate intesity 2.5-5 hours per week     - use pedometer/step counter to track activity     - goal to lose 5-10% of initial body weight     - avoid suagry drinks and juices, use zero calorie beverages     - increase water intake     - eat a low carb diet with plenty of veggies and fruit     - Get sufficient sleep 7-8 hrs nightly     - maitain active lifestyle     - avoid alcohol     - recommend 2-3 cups Coffee daily     - Counsel on lowering cholesterol by having a diet rich in vegetables,          protein (avoid red meats) and good fats(fish, salmon). _ Talk to your PCP about weight loss medication such as semaglutide

## 2023-07-13 ENCOUNTER — Ambulatory Visit (INDEPENDENT_AMBULATORY_CARE_PROVIDER_SITE_OTHER): Payer: No Typology Code available for payment source | Admitting: Internal Medicine

## 2023-07-13 ENCOUNTER — Encounter: Payer: Self-pay | Admitting: Internal Medicine

## 2023-07-13 VITALS — BP 132/85 | HR 96 | Ht 69.0 in | Wt 302.6 lb

## 2023-07-13 DIAGNOSIS — I10 Essential (primary) hypertension: Secondary | ICD-10-CM | POA: Diagnosis not present

## 2023-07-13 DIAGNOSIS — M5441 Lumbago with sciatica, right side: Secondary | ICD-10-CM

## 2023-07-13 DIAGNOSIS — R7303 Prediabetes: Secondary | ICD-10-CM

## 2023-07-13 DIAGNOSIS — N179 Acute kidney failure, unspecified: Secondary | ICD-10-CM | POA: Diagnosis not present

## 2023-07-13 DIAGNOSIS — G4733 Obstructive sleep apnea (adult) (pediatric): Secondary | ICD-10-CM

## 2023-07-13 DIAGNOSIS — G4701 Insomnia due to medical condition: Secondary | ICD-10-CM

## 2023-07-13 DIAGNOSIS — G8929 Other chronic pain: Secondary | ICD-10-CM

## 2023-07-13 MED ORDER — CYCLOBENZAPRINE HCL 5 MG PO TABS: 5.0000 mg | ORAL_TABLET | Freq: Two times a day (BID) | ORAL | 1 refills | Status: DC | PRN

## 2023-07-13 NOTE — Progress Notes (Signed)
Acute Office Visit  Subjective:    Patient ID: Adam Crosby, male    DOB: Mar 13, 1976, 47 y.o.   MRN: [id number]  Chief Complaint  Patient presents with   Flank Pain    Patient did bloodwork with gastro , showed he has a kidney infection     HPI Patient is in today for evaluation of his kidney function test.  He recently went to urgent care due to episodes of lightheadedness at his workplace, where he felt like overheated/dehydrated.  He admits that he was not drinking enough water.  Denies any episode of vomiting or diarrhea recently.  Denies any dysuria, hematuria or urinary incontinence currently.  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  Denies saddle anesthesia, urinary or stool incontinence.  He was given tizanidine from urgent care, but it was not of approved by his insurance.  Denies any recent injury or fall.  He also reports insomnia.  He has severe sleep apnea noted on sleep study, but has not been able to get CPAP device yet.  Past Medical History:  Diagnosis Date   Acute right-sided low back pain with right-sided sciatica 05/11/2022   Allergy    Arthritis    Encounter for general adult medical examination with abnormal findings 11/16/2022   GERD (gastroesophageal reflux disease) 10/09/2016   Hypertension    Lumbar vertebral fracture (HCC) 01/30/2018   OSA (obstructive sleep apnea) 10/22/2016    History reviewed. No pertinent surgical history.  Family History  Problem Relation Age of Onset   Arthritis Mother    Hyperlipidemia Mother    Hypertension Mother    Varicose Veins Mother    Diabetes Daughter    Arthritis Maternal Grandmother    Cancer Maternal Grandmother    Diabetes Maternal Grandmother    Varicose Veins Maternal Grandmother    Arthritis Maternal Grandfather    Cancer Maternal Grandfather    Hearing loss Maternal Grandfather     Social History    Socioeconomic History   Marital status: Divorced    Spouse name: Not on file   Number of children: Not on file   Years of education: Not on file   Highest education level: GED or equivalent  Occupational History   Not on file  Tobacco Use   Smoking status: Never   Smokeless tobacco: Never  Substance and Sexual Activity   Alcohol use: Yes    Alcohol/week: 1.0 standard drink of alcohol    Types: 1 Cans of beer per week    Comment: EVERYOTHER MONTH   Drug use: No   Sexual activity: Yes  Other Topics Concern   Not on file  Social History Narrative   Not on file   Social Determinants of Health   Financial Resource Strain: High Risk (05/10/2023)   Overall Financial Resource Strain (CARDIA)    Difficulty of Paying Living Expenses: Hard  Food Insecurity: Food Insecurity Present (05/10/2023)   Hunger Vital Sign    Worried About Running Out of Food in the Last Year: Sometimes true    Ran Out of Food in the Last Year: Sometimes true  Transportation Needs: No Transportation Needs (05/10/2023)   PRAPARE - Administrator, Civil Service (Medical): No    Lack of Transportation (Non-Medical): No  Physical Activity: Sufficiently Active (05/10/2023)   Exercise Vital Sign    Days of Exercise per Week: 5 days  Minutes of Exercise per Session: 30 min  Stress: Stress Concern Present (05/10/2023)   [employer] of Occupational Health - Occupational Stress Questionnaire    Feeling of Stress : Very much  Social Connections: Moderately Isolated (05/10/2023)   Social Connection and Isolation Panel [NHANES]    Frequency of Communication with Friends and Family: More than three times a week    Frequency of Social Gatherings with Friends and Family: More than three times a week    Attends Religious Services: 1 to 4 times per year    Active Member of [employer] or Organizations: No    Attends Engineer, structural: Not on file    Marital Status: Divorced  Catering manager Violence:  Not on file    Outpatient Medications Prior to Visit  Medication Sig Dispense Refill   albuterol (VENTOLIN HFA) 108 (90 Base) MCG/ACT inhaler Inhale 2 puffs into the lungs every 4 (four) hours as needed for wheezing or shortness of breath. 18 g 0   chlorthalidone (HYGROTON) 25 MG tablet TAKE 1/2 TABLET(12.5 MG) BY MOUTH DAILY 45 tablet 1   losartan (COZAAR) 100 MG tablet TAKE 1 TABLET(100 MG) BY MOUTH DAILY 30 tablet 3   metoprolol succinate (TOPROL-XL) 25 MG 24 hr tablet TAKE 1 TABLET(25 MG) BY MOUTH DAILY 30 tablet 3   NIFEdipine (PROCARDIA XL/NIFEDICAL XL) 60 MG 24 hr tablet Take 1 tablet (60 mg total) by mouth daily. 30 tablet 3   psyllium (METAMUCIL) 58.6 % packet Take 1 packet by mouth 2 (two) times daily. 60 packet 2   sildenafil (VIAGRA) 50 MG tablet Take 1 tablet (50 mg total) by mouth daily as needed for erectile dysfunction. 10 tablet 2   traZODone (DESYREL) 50 MG tablet Take 0.5-1 tablets (25-50 mg total) by mouth at bedtime as needed for sleep. (Patient not taking: Reported on 07/06/2023) 30 tablet 3   hydrOXYzine (ATARAX) 25 MG tablet Take 1 tablet (25 mg total) by mouth at bedtime as needed. 20 tablet 0   methocarbamol (ROBAXIN) 500 MG tablet Take 1 tablet (500 mg total) by mouth every 8 (eight) hours as needed for muscle spasms. 30 tablet 0   naproxen (NAPROSYN) 500 MG tablet TAKE 1 TABLET(500 MG) BY MOUTH TWICE DAILY WITH A MEAL 30 tablet 0   tiZANidine (ZANAFLEX) 4 MG capsule Take 1 capsule (4 mg total) by mouth 3 (three) times daily as needed for muscle spasms. Do not drink alcohol or drive while taking this medication.  May cause drowsiness. 15 capsule 0   No facility-administered medications prior to visit.    No Known Allergies  Review of Systems  Constitutional:  Positive for fatigue. Negative for chills and fever.  HENT:  Negative for congestion and sore throat.   Eyes:  Negative for pain and discharge.  Respiratory:  Negative for cough and shortness of breath.    Cardiovascular:  Positive for leg swelling. Negative for chest pain and palpitations.  Gastrointestinal:  Negative for diarrhea, nausea and vomiting.  Endocrine: Negative for polydipsia and polyuria.  Genitourinary:  Negative for dysuria and hematuria.  Musculoskeletal:  Positive for back pain. Negative for neck pain and neck stiffness.  Skin:  Negative for rash.  Neurological:  Negative for weakness, numbness and headaches.  Psychiatric/Behavioral:  Positive for sleep disturbance. Negative for agitation and behavioral problems.        Objective:    Physical Exam Vitals reviewed.  Constitutional:      General: He is not in acute distress.  Appearance: He is obese. He is not diaphoretic.  HENT:     Head: Normocephalic and atraumatic.     Nose: Nose normal.     Mouth/Throat:     Mouth: Mucous membranes are moist.  Eyes:     General: No scleral icterus.    Extraocular Movements: Extraocular movements intact.  Cardiovascular:     Rate and Rhythm: Normal rate and regular rhythm.     Pulses: Normal pulses.     Heart sounds: Normal heart sounds. No murmur heard. Pulmonary:     Breath sounds: Normal breath sounds. No wheezing or rales.  Musculoskeletal:     Cervical back: Neck supple.     Right lower leg: Edema (1+) present.     Left lower leg: Edema (1+) present.  Skin:    General: Skin is warm.     Findings: No rash.  Neurological:     General: No focal deficit present.     Mental Status: He is alert and oriented to person, place, and time.     Sensory: No sensory deficit.     Motor: No weakness.  Psychiatric:        Mood and Affect: Mood normal.        Behavior: Behavior normal.     BP 132/85 (BP Location: Right Arm, Patient Position: Sitting, Cuff Size: Large)   Pulse 96   Ht 5\' 9"  (1.753 m)   Wt (!) 302 lb 9.6 oz (137.3 kg)   SpO2 93%   BMI 44.69 kg/m  Wt Readings from Last 3 Encounters:  07/13/23 (!) 302 lb 9.6 oz (137.3 kg)  07/06/23 (!) 302 lb 3.2 oz  (137.1 kg)  05/11/23 (!) 307 lb 6.4 oz (139.4 kg)        Assessment & Plan:   Problem List Items Addressed This Visit       Cardiovascular and Mediastinum   Essential hypertension    BP Readings from Last 1 Encounters:  07/13/23 132/85   Well-controlled with Losartan 100 mg QD, Metoprolol 25 mg QD, Nifedipine 60 mg QD and Chlorthalidone 25 mg QD Likely a component of OSA as well Counseled for compliance with the medications Advised DASH diet and moderate exercise/walking, at least 150 mins/week        Respiratory   OSA (obstructive sleep apnea)    Snoring, chronic fatigue, apneic episodes and uncontrolled HTN in association with morbid obesity STOP-BANG: 7 Home sleep study showed severe OSA Needs CPAP - patient still waiting on cpap , patient had recent insurance change, ordered again       Relevant Orders   For home use only DME continuous positive airway pressure (CPAP)     Nervous and Auditory   Chronic right-sided low back pain with right-sided sciatica    Due to work-related overexertion Advised to avoid heavy lifting and frequent bending -work note provided Naproxen as needed for pain Checked x-ray lumbar spine in 07/23 - benign, but he has history of lumbar compression fractures in 2019 Had drowsiness with Robaxin - Flexeril PRN for muscle spasms      Relevant Medications   cyclobenzaprine (FLEXERIL) 5 MG tablet     Genitourinary   AKI (acute kidney injury) (HCC) - Primary    Likely due to dehydration Needs to maintain adequate hydration If persistent decline in kidney function, may need to stop chlorthalidone Avoid nephrotoxic agents      Relevant Orders   Basic Metabolic Panel (BMET) (Completed)   UA/M w/rflx Culture, Routine (  Completed)     Other   Insomnia due to medical condition    His insomnia is mostly due to OSA, but underlying anxiety also leading to insomnia Sleep hygiene discussed, material provided Improved with trazodone as  needed DC Vistaril as it was causing dizziness -  was prescribed from urgent care      Prediabetes    Lab Results  Component Value Date   HGBA1C 5.9 (H) 07/13/2023   Advised to follow low-carb diet      Relevant Orders   Hemoglobin A1c (Completed)     Meds ordered this encounter  Medications   cyclobenzaprine (FLEXERIL) 5 MG tablet    Sig: Take 1 tablet (5 mg total) by mouth 2 (two) times daily as needed.    Dispense:  30 tablet    Refill:  1     Lynnley Doddridge Concha Se, MD

## 2023-07-13 NOTE — Patient Instructions (Signed)
Please continue taking Trazodone as needed for insomnia. Please do not take Vistaril.  Please continue to take medications as prescribed.  Please continue to follow low carb diet and perform moderate exercise/walking at least 150 mins/week.

## 2023-07-14 ENCOUNTER — Encounter (INDEPENDENT_AMBULATORY_CARE_PROVIDER_SITE_OTHER): Payer: Self-pay

## 2023-07-14 ENCOUNTER — Other Ambulatory Visit: Payer: Self-pay | Admitting: Internal Medicine

## 2023-07-14 DIAGNOSIS — G8929 Other chronic pain: Secondary | ICD-10-CM

## 2023-07-14 DIAGNOSIS — R7303 Prediabetes: Secondary | ICD-10-CM | POA: Insufficient documentation

## 2023-07-14 LAB — UA/M W/RFLX CULTURE, ROUTINE
Bilirubin, UA: NEGATIVE
Glucose, UA: NEGATIVE
Ketones, UA: NEGATIVE
Leukocytes,UA: NEGATIVE
Nitrite, UA: NEGATIVE
Protein,UA: NEGATIVE
RBC, UA: NEGATIVE
Specific Gravity, UA: 1.02 (ref 1.005–1.030)
Urobilinogen, Ur: 0.2 mg/dL (ref 0.2–1.0)
pH, UA: 6.5 (ref 5.0–7.5)

## 2023-07-14 LAB — MICROSCOPIC EXAMINATION
Bacteria, UA: NONE SEEN
Casts: NONE SEEN /LPF
Epithelial Cells (non renal): NONE SEEN /HPF (ref 0–10)
RBC, Urine: NONE SEEN /HPF (ref 0–2)
WBC, UA: NONE SEEN /HPF (ref 0–5)

## 2023-07-14 LAB — HEMOGLOBIN A1C
Est. average glucose Bld gHb Est-mCnc: 123 mg/dL
Hgb A1c MFr Bld: 5.9 % — ABNORMAL HIGH (ref 4.8–5.6)

## 2023-07-14 LAB — BASIC METABOLIC PANEL
BUN/Creatinine Ratio: 14 (ref 9–20)
BUN: 18 mg/dL (ref 6–24)
CO2: 35 mmol/L — ABNORMAL HIGH (ref 20–29)
Calcium: 10.1 mg/dL (ref 8.7–10.2)
Chloride: 98 mmol/L (ref 96–106)
Creatinine, Ser: 1.29 mg/dL — ABNORMAL HIGH (ref 0.76–1.27)
Glucose: 88 mg/dL (ref 70–99)
Potassium: 3.8 mmol/L (ref 3.5–5.2)
Sodium: 144 mmol/L (ref 134–144)
eGFR: 70 mL/min/{1.73_m2} (ref >59)

## 2023-07-14 NOTE — Assessment & Plan Note (Signed)
Lab Results  Component Value Date   HGBA1C 5.9 (H) 07/13/2023   Advised to follow low-carb diet

## 2023-07-14 NOTE — Assessment & Plan Note (Signed)
Likely due to dehydration Needs to maintain adequate hydration If persistent decline in kidney function, may need to stop chlorthalidone Avoid nephrotoxic agents

## 2023-07-14 NOTE — Assessment & Plan Note (Signed)
BP Readings from Last 1 Encounters:  07/13/23 132/85   Well-controlled with Losartan 100 mg QD, Metoprolol 25 mg QD, Nifedipine 60 mg QD and Chlorthalidone 25 mg QD Likely a component of OSA as well Counseled for compliance with the medications Advised DASH diet and moderate exercise/walking, at least 150 mins/week

## 2023-07-14 NOTE — Assessment & Plan Note (Signed)
Snoring, chronic fatigue, apneic episodes and uncontrolled HTN in association with morbid obesity STOP-BANG: 7 Home sleep study showed severe OSA Needs CPAP - patient still waiting on cpap , patient had recent insurance change, ordered again

## 2023-07-14 NOTE — Assessment & Plan Note (Addendum)
Due to work-related overexertion Advised to avoid heavy lifting and frequent bending -work note provided Naproxen as needed for pain Checked x-ray lumbar spine in 07/23 - benign, but he has history of lumbar compression fractures in 2019 Had drowsiness with Robaxin - Flexeril PRN for muscle spasms

## 2023-07-14 NOTE — Assessment & Plan Note (Signed)
His insomnia is mostly due to OSA, but underlying anxiety also leading to insomnia Sleep hygiene discussed, material provided Improved with trazodone as needed DC Vistaril as it was causing dizziness -  was prescribed from urgent care

## 2023-07-27 ENCOUNTER — Telehealth (INDEPENDENT_AMBULATORY_CARE_PROVIDER_SITE_OTHER): Payer: Self-pay | Admitting: Gastroenterology

## 2023-07-27 NOTE — Telephone Encounter (Signed)
Jilda Panda, LPN Patient LM to cancel and reschedule.  I placed in depot.  Advised pt to call office. (TCS ASA 3//Ahmed)

## 2023-07-30 ENCOUNTER — Other Ambulatory Visit: Payer: Self-pay | Admitting: Internal Medicine

## 2023-07-30 DIAGNOSIS — G8929 Other chronic pain: Secondary | ICD-10-CM

## 2023-08-03 ENCOUNTER — Encounter (HOSPITAL_COMMUNITY): Payer: No Typology Code available for payment source

## 2023-08-05 ENCOUNTER — Ambulatory Visit (HOSPITAL_COMMUNITY): Admission: RE | Admit: 2023-08-05 | Payer: No Typology Code available for payment source | Source: Home / Self Care

## 2023-08-05 ENCOUNTER — Encounter (HOSPITAL_COMMUNITY): Admission: RE | Payer: Self-pay | Source: Home / Self Care

## 2023-08-05 SURGERY — COLONOSCOPY WITH PROPOFOL
Anesthesia: Monitor Anesthesia Care

## 2023-08-10 ENCOUNTER — Ambulatory Visit: Payer: No Typology Code available for payment source | Admitting: Internal Medicine

## 2023-08-11 ENCOUNTER — Other Ambulatory Visit: Payer: Self-pay | Admitting: Internal Medicine

## 2023-08-11 DIAGNOSIS — G8929 Other chronic pain: Secondary | ICD-10-CM

## 2023-08-14 ENCOUNTER — Other Ambulatory Visit: Payer: Self-pay | Admitting: Internal Medicine

## 2023-08-14 DIAGNOSIS — I10 Essential (primary) hypertension: Secondary | ICD-10-CM

## 2023-08-16 ENCOUNTER — Other Ambulatory Visit: Payer: Self-pay | Admitting: Internal Medicine

## 2023-08-16 DIAGNOSIS — N529 Male erectile dysfunction, unspecified: Secondary | ICD-10-CM

## 2023-09-05 ENCOUNTER — Other Ambulatory Visit: Payer: Self-pay | Admitting: Internal Medicine

## 2023-09-05 DIAGNOSIS — G8929 Other chronic pain: Secondary | ICD-10-CM

## 2023-09-13 ENCOUNTER — Other Ambulatory Visit: Payer: Self-pay | Admitting: Internal Medicine

## 2023-09-13 DIAGNOSIS — G4701 Insomnia due to medical condition: Secondary | ICD-10-CM

## 2023-10-13 ENCOUNTER — Other Ambulatory Visit: Payer: Self-pay | Admitting: Internal Medicine

## 2023-10-13 DIAGNOSIS — G8929 Other chronic pain: Secondary | ICD-10-CM

## 2023-11-06 ENCOUNTER — Other Ambulatory Visit: Payer: Self-pay | Admitting: Internal Medicine

## 2023-11-06 DIAGNOSIS — G8929 Other chronic pain: Secondary | ICD-10-CM

## 2023-11-17 ENCOUNTER — Other Ambulatory Visit: Payer: Self-pay | Admitting: Internal Medicine

## 2023-11-17 DIAGNOSIS — G8929 Other chronic pain: Secondary | ICD-10-CM

## 2023-11-23 ENCOUNTER — Ambulatory Visit (INDEPENDENT_AMBULATORY_CARE_PROVIDER_SITE_OTHER): Payer: BLUE CROSS/BLUE SHIELD | Admitting: Internal Medicine

## 2023-11-23 VITALS — BP 129/78 | HR 90 | Ht 69.0 in | Wt 309.2 lb

## 2023-11-23 DIAGNOSIS — I10 Essential (primary) hypertension: Secondary | ICD-10-CM | POA: Diagnosis not present

## 2023-11-23 DIAGNOSIS — Z0001 Encounter for general adult medical examination with abnormal findings: Secondary | ICD-10-CM | POA: Diagnosis not present

## 2023-11-23 DIAGNOSIS — G4701 Insomnia due to medical condition: Secondary | ICD-10-CM

## 2023-11-23 DIAGNOSIS — G4733 Obstructive sleep apnea (adult) (pediatric): Secondary | ICD-10-CM

## 2023-11-23 DIAGNOSIS — R7303 Prediabetes: Secondary | ICD-10-CM

## 2023-11-23 MED ORDER — CHLORTHALIDONE 25 MG PO TABS
12.5000 mg | ORAL_TABLET | Freq: Every day | ORAL | 1 refills | Status: DC
Start: 2023-11-23 — End: 2024-09-25

## 2023-11-23 MED ORDER — NIFEDIPINE ER OSMOTIC RELEASE 60 MG PO TB24
60.0000 mg | ORAL_TABLET | Freq: Every day | ORAL | 1 refills | Status: DC
Start: 2023-11-23 — End: 2023-12-30

## 2023-11-23 MED ORDER — LOSARTAN POTASSIUM 100 MG PO TABS
100.0000 mg | ORAL_TABLET | Freq: Every day | ORAL | 1 refills | Status: DC
Start: 2023-11-23 — End: 2024-02-02

## 2023-11-23 NOTE — Assessment & Plan Note (Addendum)
 His insomnia is mostly due to OSA, but underlying anxiety also leading to insomnia Sleep hygiene discussed, material provided Improved with CPAP use Has trazodone as needed

## 2023-11-23 NOTE — Assessment & Plan Note (Addendum)
 Snoring, chronic fatigue, apneic episodes and uncontrolled HTN in association with morbid obesity STOP-BANG: 7 Home sleep study showed severe OSA Has CPAP now, advised to use it regularly - continues to benefit from CPAP use  Advised to check for Zepbound for [facility], would benefit from weight loss

## 2023-11-23 NOTE — Assessment & Plan Note (Signed)
 BP Readings from Last 1 Encounters:  11/23/23 129/78   Well-controlled with Losartan  100 mg QD, Metoprolol  25 mg QD, Nifedipine  60 mg QD and Chlorthalidone  25 mg QD Likely a component of OSA as well Advised to take chlorthalidone  12.5 mg once daily as he has urinary frequency with 25 mg dose Counseled for compliance with the medications Advised DASH diet and moderate exercise/walking, at least 150 mins/week

## 2023-11-23 NOTE — Progress Notes (Signed)
 Established Patient Office Visit  Subjective:  Patient ID: Adam Crosby, male    DOB: 09-Jul-1976  Age: 48 y.o. MRN: [id number]  CC:  Chief Complaint  Patient presents with   Annual Exam    HPI Adam Crosby is a 48 y.o. male with past medical history of HTN, OSA, and morbid obesity who presents for annual physical.  BP is well-controlled. Takes losartan  100 mg QD, nifedipine  60 mg QD and chlorthalidone  25 mg QD regularly. Patient denies headache, dizziness, chest pain, dyspnea or palpitations.  He has noticed urinary frequency due to chlorthalidone , which has led to frequent breaks at workplace.  OSA: He has started using CPAP recently, and has been trying to use it regularly. He has noticed significant improvement in his sleep quality and daytime fatigue when he can use it for more than 6 hours.  He agrees to be more regular in terms of CPAP use.  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  He also reports upper chest wall pain at times. Denies saddle anesthesia, urinary or stool incontinence.  He has Flexeril  as needed for muscle spasms.  Denies any recent injury or fall.  Past Medical History:  Diagnosis Date   Acute right-sided low back pain with right-sided sciatica 05/11/2022   Allergy    Arthritis    Encounter for general adult medical examination with abnormal findings 11/16/2022   GERD (gastroesophageal reflux disease) 10/09/2016   Hypertension    Lumbar vertebral fracture (HCC) 01/30/2018   OSA (obstructive sleep apnea) 10/22/2016    History reviewed. No pertinent surgical history.  Family History  Problem Relation Age of Onset   Arthritis Mother    Hyperlipidemia Mother    Hypertension Mother    Varicose Veins Mother    Diabetes Daughter    Arthritis Maternal Grandmother    Cancer Maternal Grandmother    Diabetes Maternal Grandmother    Varicose Veins Maternal  Grandmother    Arthritis Maternal Grandfather    Cancer Maternal Grandfather    Hearing loss Maternal Grandfather     Social History   Socioeconomic History   Marital status: Divorced    Spouse name: Not on file   Number of children: Not on file   Years of education: Not on file   Highest education level: GED or equivalent  Occupational History   Not on file  Tobacco Use   Smoking status: Never   Smokeless tobacco: Never  Substance and Sexual Activity   Alcohol use: Yes    Alcohol/week: 1.0 standard drink of alcohol    Types: 1 Cans of beer per week    Comment: EVERYOTHER MONTH   Drug use: No   Sexual activity: Yes  Other Topics Concern   Not on file  Social History Narrative   Not on file   Social Drivers of Health   Financial Resource Strain: Medium Risk (11/23/2023)   Overall Financial Resource Strain (CARDIA)    Difficulty of Paying Living Expenses: Somewhat hard  Food Insecurity: Food Insecurity Present (11/23/2023)   Hunger Vital Sign    Worried About Running Out of Food in the Last Year: Sometimes true    Ran Out of Food in the Last Year: Sometimes true  Transportation Needs: No Transportation Needs (11/23/2023)   PRAPARE - Administrator, Civil Service (Medical): No    Lack of Transportation (Non-Medical): No  Physical  Activity: Inactive (11/23/2023)   Exercise Vital Sign    Days of Exercise per Week: 0 days    Minutes of Exercise per Session: 30 min  Stress: Stress Concern Present (11/23/2023)   [employer] of Occupational Health - Occupational Stress Questionnaire    Feeling of Stress : To some extent  Social Connections: Moderately Integrated (11/23/2023)   Social Connection and Isolation Panel [NHANES]    Frequency of Communication with Friends and Family: More than three times a week    Frequency of Social Gatherings with Friends and Family: More than three times a week    Attends Religious Services: 1 to 4 times per year    Active  Member of [employer] or Organizations: Yes    Attends Banker Meetings: 1 to 4 times per year    Marital Status: Divorced  Catering Manager Violence: Not on file    Outpatient Medications Prior to Visit  Medication Sig Dispense Refill   albuterol  (VENTOLIN  HFA) 108 (90 Base) MCG/ACT inhaler Inhale 2 puffs into the lungs every 4 (four) hours as needed for wheezing or shortness of breath. 18 g 0   cyclobenzaprine  (FLEXERIL ) 5 MG tablet Take 1 tablet (5 mg total) by mouth 2 (two) times daily as needed. 30 tablet 1   metoprolol  succinate (TOPROL -XL) 25 MG 24 hr tablet TAKE 1 TABLET(25 MG) BY MOUTH DAILY 30 tablet 3   naproxen  (NAPROSYN ) 500 MG tablet TAKE 1 TABLET(500 MG) BY MOUTH TWICE DAILY WITH A MEAL 30 tablet 0   sildenafil  (VIAGRA ) 50 MG tablet TAKE 1 TABLET(50 MG) BY MOUTH DAILY AS NEEDED FOR ERECTILE DYSFUNCTION 10 tablet 2   traZODone  (DESYREL ) 50 MG tablet TAKE 1/2 TO 1 TABLET(25 TO 50 MG) BY MOUTH AT BEDTIME AS NEEDED FOR SLEEP 30 tablet 3   chlorthalidone  (HYGROTON ) 25 MG tablet TAKE 1/2 TABLET(12.5 MG) BY MOUTH DAILY 45 tablet 1   losartan  (COZAAR ) 100 MG tablet TAKE 1 TABLET(100 MG) BY MOUTH DAILY 30 tablet 3   NIFEdipine  (PROCARDIA  XL/NIFEDICAL XL) 60 MG 24 hr tablet TAKE 1 TABLET(60 MG) BY MOUTH DAILY 30 tablet 3   No facility-administered medications prior to visit.    No Known Allergies  ROS Review of Systems  Constitutional:  Positive for fatigue. Negative for chills and fever.  HENT:  Negative for congestion and sore throat.   Eyes:  Negative for pain and discharge.  Respiratory:  Negative for cough and shortness of breath.   Cardiovascular:  Positive for leg swelling. Negative for chest pain and palpitations.  Gastrointestinal:  Negative for diarrhea, nausea and vomiting.  Endocrine: Negative for polydipsia and polyuria.  Genitourinary:  Positive for frequency. Negative for dysuria and hematuria.  Musculoskeletal:  Positive for back pain. Negative for neck  pain and neck stiffness.  Skin:  Negative for rash.  Neurological:  Negative for weakness, numbness and headaches.  Psychiatric/Behavioral:  Positive for sleep disturbance. Negative for agitation and behavioral problems.       Objective:    Physical Exam Vitals reviewed.  Constitutional:      General: He is not in acute distress.    Appearance: He is obese. He is not diaphoretic.  HENT:     Head: Normocephalic and atraumatic.     Nose: Nose normal.     Mouth/Throat:     Mouth: Mucous membranes are moist.  Eyes:     General: No scleral icterus.    Extraocular Movements: Extraocular movements intact.  Cardiovascular:  Rate and Rhythm: Normal rate and regular rhythm.     Pulses: Normal pulses.     Heart sounds: Normal heart sounds. No murmur heard. Pulmonary:     Breath sounds: Normal breath sounds. No wheezing or rales.  Abdominal:     Palpations: Abdomen is soft.     Tenderness: There is no abdominal tenderness.  Musculoskeletal:     Cervical back: Neck supple.     Right lower leg: Edema (Trace) present.     Left lower leg: Edema (Trace) present.  Skin:    General: Skin is warm.     Findings: No rash.  Neurological:     General: No focal deficit present.     Mental Status: He is alert and oriented to person, place, and time.     Sensory: No sensory deficit.     Motor: No weakness.  Psychiatric:        Mood and Affect: Mood normal.        Behavior: Behavior normal.     BP 129/78   Pulse 90   Ht 5' 9 (1.753 m)   Wt (!) 309 lb 3.2 oz (140.3 kg)   SpO2 92%   BMI 45.66 kg/m  Wt Readings from Last 3 Encounters:  11/23/23 (!) 309 lb 3.2 oz (140.3 kg)  07/13/23 (!) 302 lb 9.6 oz (137.3 kg)  07/06/23 (!) 302 lb 3.2 oz (137.1 kg)    Lab Results  Component Value Date   TSH 1.190 11/16/2022   Lab Results  Component Value Date   WBC 6.4 07/02/2023   HGB 15.3 07/02/2023   HCT 48.4 07/02/2023   MCV 86 07/02/2023   PLT 306 07/02/2023   Lab Results   Component Value Date   NA 144 07/13/2023   K 3.8 07/13/2023   CO2 35 (H) 07/13/2023   GLUCOSE 88 07/13/2023   BUN 18 07/13/2023   CREATININE 1.29 (H) 07/13/2023   BILITOT 0.5 07/02/2023   ALKPHOS 70 07/02/2023   AST 20 07/02/2023   ALT 32 07/02/2023   PROT 6.9 07/02/2023   ALBUMIN 4.1 07/02/2023   CALCIUM 10.1 07/13/2023   ANIONGAP 9 05/19/2020   EGFR 70 07/13/2023   Lab Results  Component Value Date   CHOL 172 11/16/2022   Lab Results  Component Value Date   HDL 32 (L) 11/16/2022   Lab Results  Component Value Date   LDLCALC 116 (H) 11/16/2022   Lab Results  Component Value Date   TRIG 135 11/16/2022   Lab Results  Component Value Date   CHOLHDL 5.4 (H) 11/16/2022   Lab Results  Component Value Date   HGBA1C 5.9 (H) 07/13/2023      Assessment & Plan:   Problem List Items Addressed This Visit       Cardiovascular and Mediastinum   Essential hypertension   BP Readings from Last 1 Encounters:  11/23/23 129/78   Well-controlled with Losartan  100 mg QD, Metoprolol  25 mg QD, Nifedipine  60 mg QD and Chlorthalidone  25 mg QD Likely a component of OSA as well Advised to take chlorthalidone  12.5 mg once daily as he has urinary frequency with 25 mg dose Counseled for compliance with the medications Advised DASH diet and moderate exercise/walking, at least 150 mins/week      Relevant Medications   losartan  (COZAAR ) 100 MG tablet   NIFEdipine  (PROCARDIA  XL/NIFEDICAL XL) 60 MG 24 hr tablet   chlorthalidone  (HYGROTON ) 25 MG tablet   Other Relevant Orders   Basic Metabolic  Panel (BMET)     Respiratory   OSA (obstructive sleep apnea) - Primary   Snoring, chronic fatigue, apneic episodes and uncontrolled HTN in association with morbid obesity STOP-BANG: 7 Home sleep study showed severe OSA Has CPAP now, advised to use it regularly - continues to benefit from CPAP use  Advised to check for Zepbound for [facility], would benefit from weight loss        Other    Morbid obesity (HCC)   BMI Readings from Last 3 Encounters:  11/23/23 45.66 kg/m  07/13/23 44.69 kg/m  07/06/23 44.63 kg/m   Diet modification and moderate exercise/walking at least 150 mins/week DASH diet material provided      Encounter for general adult medical examination with abnormal findings   Physical exam as documented. Counseling done  re healthy lifestyle involving commitment to 150 minutes exercise per week, heart healthy diet, and attaining healthy weight.The importance of adequate sleep also discussed. Immunization and cancer screening needs are specifically addressed at this visit.      Insomnia due to medical condition   His insomnia is mostly due to OSA, but underlying anxiety also leading to insomnia Sleep hygiene discussed, material provided Improved with CPAP use Has trazodone  as needed      Prediabetes   Lab Results  Component Value Date   HGBA1C 5.9 (H) 07/13/2023   Advised to follow low-carb diet       Meds ordered this encounter  Medications   losartan  (COZAAR ) 100 MG tablet    Sig: Take 1 tablet (100 mg total) by mouth daily.    Dispense:  90 tablet    Refill:  1   NIFEdipine  (PROCARDIA  XL/NIFEDICAL XL) 60 MG 24 hr tablet    Sig: Take 1 tablet (60 mg total) by mouth daily.    Dispense:  90 tablet    Refill:  1   chlorthalidone  (HYGROTON ) 25 MG tablet    Sig: Take 0.5 tablets (12.5 mg total) by mouth daily.    Dispense:  45 tablet    Refill:  1    Follow-up: Return in about 4 months (around 03/22/2024) for HTN and OSA.    Suzzane MARLA Blanch, MD

## 2023-11-23 NOTE — Assessment & Plan Note (Signed)
 BMI Readings from Last 3 Encounters:  11/23/23 45.66 kg/m  07/13/23 44.69 kg/m  07/06/23 44.63 kg/m   Diet modification and moderate exercise/walking at least 150 mins/week DASH diet material provided

## 2023-11-23 NOTE — Assessment & Plan Note (Signed)
 Physical exam as documented. Counseling done  re healthy lifestyle involving commitment to 150 minutes exercise per week, heart healthy diet, and attaining healthy weight.The importance of adequate sleep also discussed. Immunization and cancer screening needs are specifically addressed at this visit.

## 2023-11-23 NOTE — Assessment & Plan Note (Signed)
Lab Results  Component Value Date   HGBA1C 5.9 (H) 07/13/2023   Advised to follow low-carb diet

## 2023-11-23 NOTE — Patient Instructions (Addendum)
 Please continue to take medications as prescribed.  Please continue to follow low carb diet and perform moderate exercise/walking at least 150 mins/week.  Please ask your insurance about coverage for Zepbound or [id number].

## 2023-11-24 LAB — BASIC METABOLIC PANEL
BUN/Creatinine Ratio: 14 (ref 9–20)
BUN: 18 mg/dL (ref 6–24)
CO2: 29 mmol/L (ref 20–29)
Calcium: 9.4 mg/dL (ref 8.7–10.2)
Chloride: 104 mmol/L (ref 96–106)
Creatinine, Ser: 1.3 mg/dL — ABNORMAL HIGH (ref 0.76–1.27)
Glucose: 98 mg/dL (ref 70–99)
Potassium: 4.2 mmol/L (ref 3.5–5.2)
Sodium: 144 mmol/L (ref 134–144)
eGFR: 69 mL/min/{1.73_m2} (ref >59)

## 2023-12-30 ENCOUNTER — Other Ambulatory Visit: Payer: Self-pay | Admitting: Internal Medicine

## 2023-12-30 ENCOUNTER — Ambulatory Visit
Admission: RE | Admit: 2023-12-30 | Discharge: 2023-12-30 | Disposition: A | Discharge status: Home / Self Care | Payer: BLUE CROSS/BLUE SHIELD | Source: Ambulatory Visit | Attending: Nurse Practitioner | Admitting: Nurse Practitioner

## 2023-12-30 VITALS — BP 139/81 | HR 118 | Temp 98.8°F | Resp 20

## 2023-12-30 DIAGNOSIS — B349 Viral infection, unspecified: Secondary | ICD-10-CM

## 2023-12-30 DIAGNOSIS — I10 Essential (primary) hypertension: Secondary | ICD-10-CM

## 2023-12-30 LAB — POC COVID19/FLU A&B COMBO
Covid Antigen, POC: NEGATIVE
Influenza A Antigen, POC: NEGATIVE
Influenza B Antigen, POC: NEGATIVE

## 2023-12-30 MED ORDER — LOPERAMIDE HCL 2 MG PO CAPS: 2.0000 mg | ORAL_CAPSULE | Freq: Four times a day (QID) | ORAL | 0 refills | Status: AC | PRN

## 2023-12-30 MED ORDER — PROMETHAZINE-DM 6.25-15 MG/5ML PO SYRP: 5.0000 mL | ORAL_SOLUTION | Freq: Four times a day (QID) | ORAL | 0 refills | Status: DC | PRN

## 2023-12-30 NOTE — Discharge Instructions (Addendum)
 The COVID/flu test was negative. Take medication as prescribed. Increase fluids.  Recommend that you drink at least 5-6 bottles of water or use Pedialyte or Gatorade to prevent dehydration. May take over-the-counter Tylenol or ibuprofen as needed for pain, fever, or general discomfort. For the diarrhea, recommend dietary modifications.  I provided some information for you to refer to.  If this does not help, begin the medication prescribed today. For the cough, recommend using a humidifier in your bedroom at nighttime during sleep and sleeping elevated on pillows while cough symptoms persist. Symptoms should improve or begin to improve over the next 5 to 7 days.  If symptoms fail to improve, or appear to be worsening, you may follow-up in this clinic or with your primary care physician for further evaluation. Follow-up as needed.

## 2023-12-30 NOTE — ED Triage Notes (Signed)
 Productive Cough, diarrhea, fever, body aches since Sunday.

## 2023-12-30 NOTE — ED Provider Notes (Signed)
 [facility]    CSN: [id number] Arrival date & time: 12/30/23  1052      History   Chief Complaint Chief Complaint  Patient presents with   Fever    Entered by patient    HPI Adam Crosby is a 48 y.o. male.   The history is provided by the patient.   Patient presents for complaints of fever, body aches, cough, and diarrhea that been present for the past 3 days.  He does not recall how high his fever has been as he states he has not been able to check it at home.  Denies headache, ear pain, sore throat, wheezing, difficulty breathing, chest pain, abdominal pain, nausea, vomiting, or rash.  Reports he has been taking over-the-counter cough medication with minimal relief.  Past Medical History:  Diagnosis Date   Acute right-sided low back pain with right-sided sciatica 05/11/2022   Allergy    Arthritis    Encounter for general adult medical examination with abnormal findings 11/16/2022   GERD (gastroesophageal reflux disease) 10/09/2016   Hypertension    Lumbar vertebral fracture (HCC) 01/30/2018   OSA (obstructive sleep apnea) 10/22/2016    Patient Active Problem List   Diagnosis Date Noted   Prediabetes 07/14/2023   AKI (acute kidney injury) (HCC) 07/13/2023   Hematochezia 07/06/2023   Diarrhea due to malabsorption 07/06/2023   Insomnia due to medical condition 05/12/2023   Encounter for general adult medical examination with abnormal findings 11/16/2022   Adjustment disorder with depressed mood 11/16/2022   Erectile dysfunction 11/16/2022   Urethral discharge 10/23/2022   History of compression fracture of spine 06/15/2022   Chronic right-sided low back pain with right-sided sciatica 05/11/2022   Encounter for examination following treatment at hospital 05/11/2022   Nocturia 03/25/2021   Compression fracture of L1 lumbar vertebra (HCC) 01/31/2018   Vitamin D deficiency 10/26/2016   Morbid obesity (HCC) 10/22/2016   OSA (obstructive sleep apnea)  10/22/2016   GERD (gastroesophageal reflux disease) 10/09/2016   Essential hypertension 09/23/2016    History reviewed. No pertinent surgical history.     Home Medications    Prior to Admission medications   Medication Sig Start Date End Date Taking? Authorizing Provider  loperamide (IMODIUM) 2 MG capsule Take 1 capsule (2 mg total) by mouth 4 (four) times daily as needed for diarrhea or loose stools. Do not take more than 4 tablets within a 24-hour time period. 12/30/23  Yes Leath-Warren, Sadie Haber, NP  promethazine-dextromethorphan (PROMETHAZINE-DM) 6.25-15 MG/5ML syrup Take 5 mLs by mouth 4 (four) times daily as needed. 12/30/23  Yes Leath-Warren, Sadie Haber, NP  albuterol (VENTOLIN HFA) 108 (90 Base) MCG/ACT inhaler Inhale 2 puffs into the lungs every 4 (four) hours as needed for wheezing or shortness of breath. 04/15/21   Rodriguez-Southworth, Nettie Elm, PA-C  chlorthalidone (HYGROTON) 25 MG tablet Take 0.5 tablets (12.5 mg total) by mouth daily. 11/23/23   Anabel Halon, MD  cyclobenzaprine (FLEXERIL) 5 MG tablet Take 1 tablet (5 mg total) by mouth 2 (two) times daily as needed. 07/13/23   Anabel Halon, MD  losartan (COZAAR) 100 MG tablet Take 1 tablet (100 mg total) by mouth daily. 11/23/23   Anabel Halon, MD  metoprolol succinate (TOPROL-XL) 25 MG 24 hr tablet TAKE 1 TABLET(25 MG) BY MOUTH DAILY 12/30/23   Anabel Halon, MD  naproxen (NAPROSYN) 500 MG tablet TAKE 1 TABLET(500 MG) BY MOUTH TWICE DAILY WITH A MEAL 11/08/23   Anabel Halon, MD  NIFEdipine (PROCARDIA XL/NIFEDICAL XL) 60 MG 24 hr tablet TAKE 1 TABLET(60 MG) BY MOUTH DAILY 12/30/23   Anabel Halon, MD  sildenafil (VIAGRA) 50 MG tablet TAKE 1 TABLET(50 MG) BY MOUTH DAILY AS NEEDED FOR ERECTILE DYSFUNCTION 08/16/23   Anabel Halon, MD  traZODone (DESYREL) 50 MG tablet TAKE 1/2 TO 1 TABLET(25 TO 50 MG) BY MOUTH AT BEDTIME AS NEEDED FOR SLEEP 09/13/23   Anabel Halon, MD  hydrochlorothiazide (HYDRODIURIL) 25 MG tablet Take  1 tablet (25 mg total) by mouth daily. Patient not taking: Reported on 01/30/2018 09/23/16 05/19/20  Allayne Butcher B, PA-C  omeprazole (PRILOSEC) 20 MG capsule TAKE ONE CAPSULE BY MOUTH ONCE DAILY Patient not taking: Reported on 01/30/2018 02/25/17 05/19/20  Salley Scarlet, MD    Family History Family History  Problem Relation Age of Onset   Arthritis Mother    Hyperlipidemia Mother    Hypertension Mother    Varicose Veins Mother    Diabetes Daughter    Arthritis Maternal Grandmother    Cancer Maternal Grandmother    Diabetes Maternal Grandmother    Varicose Veins Maternal Grandmother    Arthritis Maternal Grandfather    Cancer Maternal Grandfather    Hearing loss Maternal Grandfather     Social History Social History   Tobacco Use   Smoking status: Never   Smokeless tobacco: Never  Substance Use Topics   Alcohol use: Yes    Alcohol/week: 1.0 standard drink of alcohol    Types: 1 Cans of beer per week    Comment: EVERYOTHER MONTH   Drug use: No     Allergies   Patient has no known allergies.   Review of Systems Review of Systems Per HPI  Physical Exam Triage Vital Signs ED Triage Vitals [12/30/23 1109]  Encounter Vitals Group     BP 139/81     Systolic BP Percentile      Diastolic BP Percentile      Pulse Rate (!) 118     Resp 20     Temp 98.8 F (37.1 C)     Temp Source Oral     SpO2 91 %     Weight      Height      Head Circumference      Peak Flow      Pain Score 7     Pain Loc      Pain Education      Exclude from Growth Chart    No data found.  Updated Vital Signs BP 139/81 (BP Location: Right Arm)   Pulse (!) 118   Temp 98.8 F (37.1 C) (Oral)   Resp 20   SpO2 91%   Visual Acuity Right Eye Distance:   Left Eye Distance:   Bilateral Distance:    Right Eye Near:   Left Eye Near:    Bilateral Near:     Physical Exam Vitals and nursing note reviewed.  Constitutional:      General: He is not in acute distress.    Appearance:  Normal appearance.  HENT:     Head: Normocephalic.     Right Ear: Tympanic membrane, ear canal and external ear normal.     Left Ear: Tympanic membrane, ear canal and external ear normal.     Nose: Congestion present.     Right Turbinates: Enlarged and swollen.     Left Turbinates: Enlarged and swollen.     Right Sinus: No maxillary sinus tenderness or frontal  sinus tenderness.     Left Sinus: No maxillary sinus tenderness or frontal sinus tenderness.     Mouth/Throat:     Lips: Pink.     Mouth: Mucous membranes are moist.     Pharynx: Oropharynx is clear. Uvula midline. Postnasal drip present. No pharyngeal swelling or posterior oropharyngeal erythema.     Comments: Cobblestoning present to posterior oropharynx  Eyes:     Extraocular Movements: Extraocular movements intact.     Conjunctiva/sclera: Conjunctivae normal.     Pupils: Pupils are equal, round, and reactive to light.  Cardiovascular:     Rate and Rhythm: Regular rhythm.     Pulses: Normal pulses.     Heart sounds: Normal heart sounds.  Pulmonary:     Effort: Pulmonary effort is normal. No respiratory distress.     Breath sounds: Normal breath sounds. No stridor. No wheezing, rhonchi or rales.  Abdominal:     General: Bowel sounds are normal.     Palpations: Abdomen is soft.     Tenderness: There is no abdominal tenderness.  Musculoskeletal:     Cervical back: Normal range of motion.  Skin:    General: Skin is warm and dry.  Neurological:     General: No focal deficit present.     Mental Status: He is alert and oriented to person, place, and time.  Psychiatric:        Mood and Affect: Mood normal.        Behavior: Behavior normal.      UC Treatments / Results  Labs (all labs ordered are listed, but only abnormal results are displayed) Labs Reviewed  POC COVID19/FLU A&B COMBO - Normal    EKG   Radiology No results found.  Procedures Procedures (including critical care time)  Medications Ordered in  UC Medications - No data to display  Initial Impression / Assessment and Plan / UC Course  I have reviewed the triage vital signs and the nursing notes.  Pertinent labs & imaging results that were available during my care of the patient were reviewed by me and considered in my medical decision making (see chart for details).  COVID/flu test was negative.  Symptoms are most likely of viral etiology given current presentation.  Will provide symptomatic treatment for patient's cough with Promethazine DM, for diarrhea, Imodium A-D was prescribed.  Supportive care recommendations were provided and discussed with the patient to include fluids, rest, and over-the-counter analgesics.  Advised patient to follow-up if symptoms are not improving over the next 5 to 7 days.  Patient was in agreement with this plan of care and verbalized understanding.  All questions were answered.  Patient stable for discharge.  Work note was provided.  Final Clinical Impressions(s) / UC Diagnoses   Final diagnoses:  Viral illness     Discharge Instructions      The COVID/flu test was negative. Take medication as prescribed. Increase fluids.  Recommend that you drink at least 5-6 bottles of water or use Pedialyte or Gatorade to prevent dehydration. May take over-the-counter Tylenol or ibuprofen as needed for pain, fever, or general discomfort. For the diarrhea, recommend dietary modifications.  I provided some information for you to refer to.  If this does not help, begin the medication prescribed today. For the cough, recommend using a humidifier in your bedroom at nighttime during sleep and sleeping elevated on pillows while cough symptoms persist. Symptoms should improve or begin to improve over the next 5 to 7 days.  If  symptoms fail to improve, or appear to be worsening, you may follow-up in this clinic or with your primary care physician for further evaluation. Follow-up as needed.     ED Prescriptions      Medication Sig Dispense Auth. Provider   promethazine-dextromethorphan (PROMETHAZINE-DM) 6.25-15 MG/5ML syrup Take 5 mLs by mouth 4 (four) times daily as needed. 118 mL Leath-Warren, Sadie Haber, NP   loperamide (IMODIUM) 2 MG capsule Take 1 capsule (2 mg total) by mouth 4 (four) times daily as needed for diarrhea or loose stools. Do not take more than 4 tablets within a 24-hour time period. 12 capsule Leath-Warren, Sadie Haber, NP      PDMP not reviewed this encounter.   Abran Cantor, NP 12/30/23 1143

## 2024-02-02 ENCOUNTER — Other Ambulatory Visit: Payer: Self-pay | Admitting: Internal Medicine

## 2024-02-02 DIAGNOSIS — I10 Essential (primary) hypertension: Secondary | ICD-10-CM

## 2024-02-02 DIAGNOSIS — N529 Male erectile dysfunction, unspecified: Secondary | ICD-10-CM

## 2024-02-02 MED ORDER — SILDENAFIL CITRATE 50 MG PO TABS
50.0000 mg | ORAL_TABLET | ORAL | 2 refills | Status: AC | PRN
Start: 2024-02-02 — End: ?

## 2024-02-11 ENCOUNTER — Other Ambulatory Visit: Payer: Self-pay | Admitting: Internal Medicine

## 2024-02-11 DIAGNOSIS — G8929 Other chronic pain: Secondary | ICD-10-CM

## 2024-02-19 ENCOUNTER — Other Ambulatory Visit: Payer: Self-pay | Admitting: Internal Medicine

## 2024-02-19 DIAGNOSIS — G8929 Other chronic pain: Secondary | ICD-10-CM

## 2024-02-28 ENCOUNTER — Other Ambulatory Visit: Payer: Self-pay | Admitting: Internal Medicine

## 2024-02-28 DIAGNOSIS — G8929 Other chronic pain: Secondary | ICD-10-CM

## 2024-03-06 ENCOUNTER — Other Ambulatory Visit: Payer: Self-pay | Admitting: Internal Medicine

## 2024-03-06 DIAGNOSIS — I10 Essential (primary) hypertension: Secondary | ICD-10-CM

## 2024-03-22 ENCOUNTER — Ambulatory Visit: Payer: BLUE CROSS/BLUE SHIELD | Admitting: Internal Medicine

## 2024-06-02 ENCOUNTER — Other Ambulatory Visit: Payer: Self-pay | Admitting: Internal Medicine

## 2024-06-02 DIAGNOSIS — I10 Essential (primary) hypertension: Secondary | ICD-10-CM

## 2024-08-30 DIAGNOSIS — M79662 Pain in left lower leg: Secondary | ICD-10-CM | POA: Diagnosis not present

## 2024-08-30 DIAGNOSIS — S8012XA Contusion of left lower leg, initial encounter: Secondary | ICD-10-CM | POA: Diagnosis not present

## 2024-08-30 DIAGNOSIS — R2242 Localized swelling, mass and lump, left lower limb: Secondary | ICD-10-CM | POA: Diagnosis not present

## 2024-09-20 ENCOUNTER — Ambulatory Visit: Payer: Self-pay

## 2024-09-23 ENCOUNTER — Ambulatory Visit: Payer: Self-pay

## 2024-09-25 ENCOUNTER — Ambulatory Visit: Admitting: Internal Medicine

## 2024-09-25 VITALS — BP 142/90 | HR 105 | Ht 68.0 in | Wt 307.4 lb

## 2024-09-25 DIAGNOSIS — E559 Vitamin D deficiency, unspecified: Secondary | ICD-10-CM

## 2024-09-25 DIAGNOSIS — I10 Essential (primary) hypertension: Secondary | ICD-10-CM

## 2024-09-25 DIAGNOSIS — G4733 Obstructive sleep apnea (adult) (pediatric): Secondary | ICD-10-CM | POA: Diagnosis not present

## 2024-09-25 DIAGNOSIS — E782 Mixed hyperlipidemia: Secondary | ICD-10-CM

## 2024-09-25 DIAGNOSIS — G4701 Insomnia due to medical condition: Secondary | ICD-10-CM

## 2024-09-25 DIAGNOSIS — R7303 Prediabetes: Secondary | ICD-10-CM

## 2024-09-25 DIAGNOSIS — N342 Other urethritis: Secondary | ICD-10-CM | POA: Diagnosis not present

## 2024-09-25 MED ORDER — AZITHROMYCIN 500 MG PO TABS: 1000.0000 mg | ORAL_TABLET | Freq: Every day | ORAL | 0 refills | Status: AC

## 2024-09-25 MED ORDER — TRAZODONE HCL 50 MG PO TABS: 50.0000 mg | ORAL_TABLET | Freq: Every evening | ORAL | 1 refills | Status: AC | PRN

## 2024-09-25 MED ORDER — METOPROLOL SUCCINATE ER 25 MG PO TB24: 25.0000 mg | ORAL_TABLET | Freq: Every day | ORAL | 1 refills | Status: AC

## 2024-09-25 MED ORDER — LOSARTAN POTASSIUM 100 MG PO TABS: 100.0000 mg | ORAL_TABLET | Freq: Every day | ORAL | 1 refills | Status: AC

## 2024-09-25 MED ORDER — NIFEDIPINE ER OSMOTIC RELEASE 60 MG PO TB24: 60.0000 mg | ORAL_TABLET | Freq: Every day | ORAL | 1 refills | Status: AC

## 2024-09-25 MED ORDER — CHLORTHALIDONE 25 MG PO TABS: 12.5000 mg | ORAL_TABLET | Freq: Every day | ORAL | 1 refills | Status: AC

## 2024-09-25 NOTE — Assessment & Plan Note (Addendum)
 BP Readings from Last 1 Encounters:  09/25/24 (!) 142/90   Elevated today as he has run out of his medicines Was well-controlled with Losartan  100 mg QD, Metoprolol  25 mg QD, Nifedipine  60 mg QD and Chlorthalidone  12.5 mg QD, refilled Likely a component of OSA as well Counseled for compliance with the medications Advised DASH diet and moderate exercise/walking, at least 150 mins/week

## 2024-09-25 NOTE — Assessment & Plan Note (Addendum)
 Snoring, chronic fatigue, apneic episodes and uncontrolled HTN in association with morbid obesity STOP-BANG: 7 Home sleep study showed severe OSA Has CPAP now, advised to use it regularly  Advised to check for Zepbound or Wegovy, would benefit from weight loss - but he prefers to wait for now

## 2024-09-25 NOTE — Patient Instructions (Signed)
 Please continue to take medications as prescribed.  Please continue to follow low carb diet and perform moderate exercise/walking at least 150 mins/week.  Please get fasting blood tests done before the next visit.  Please start using CPAP device regularly.

## 2024-09-25 NOTE — Progress Notes (Signed)
 Established Patient Office Visit  Subjective:  Patient ID: Adam Crosby, male    DOB: Sep 20, 1976  Age: 48 y.o. MRN: [id number]  CC:  Chief Complaint  Patient presents with   Medication Refill    Needs medications refills.    Hypertension    Has been out of bp medication     HPI Adam Crosby is a 48 y.o. male with past medical history of HTN, OSA, and morbid obesity who presents for annual physical.  BP is elevated today as he has run out of his medications due to lack of insurance.  He used to take losartan  100 mg QD, nifedipine  60 mg QD, metoprolol  25 mg QD and chlorthalidone  12.5 mg QD. Patient denies headache, dizziness, chest pain, dyspnea or palpitations.  OSA: He has stopped using CPAP due to lack of insurance, but he agrees to start using it regularly now. He has noticed significant improvement in his sleep quality and daytime fatigue when he can use it for more than 6 hours.  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 Denies saddle anesthesia, urinary or stool incontinence.  He has Flexeril  as needed for muscle spasms.  Denies any recent injury or fall.  He reports whitish urethral discharge for the last few months.  Reports being sexually active with a male partner few months ago, but he later found out that she was sexually active with other partners as well.  Denies dysuria, hematuria, urinary hesitancy or resistance.  Past Medical History:  Diagnosis Date   Acute right-sided low back pain with right-sided sciatica 05/11/2022   Allergy    Arthritis    Encounter for general adult medical examination with abnormal findings 11/16/2022   GERD (gastroesophageal reflux disease) 10/09/2016   Hypertension    Lumbar vertebral fracture (HCC) 01/30/2018   OSA (obstructive sleep apnea) 10/22/2016    History reviewed. No pertinent surgical history.  Family History  Problem  Relation Age of Onset   Arthritis Mother    Hyperlipidemia Mother    Hypertension Mother    Varicose Veins Mother    Diabetes Daughter    Arthritis Maternal Grandmother    Cancer Maternal Grandmother    Diabetes Maternal Grandmother    Varicose Veins Maternal Grandmother    Arthritis Maternal Grandfather    Cancer Maternal Grandfather    Hearing loss Maternal Grandfather     Social History   Socioeconomic History   Marital status: Divorced    Spouse name: Not on file   Number of children: Not on file   Years of education: Not on file   Highest education level: 12th grade  Occupational History   Not on file  Tobacco Use   Smoking status: Never   Smokeless tobacco: Never  Substance and Sexual Activity   Alcohol use: Yes    Alcohol/week: 1.0 standard drink of alcohol    Types: 1 Cans of beer per week    Comment: EVERYOTHER MONTH   Drug use: No   Sexual activity: Yes  Other Topics Concern   Not on file  Social History Narrative   Not on file   Social Drivers of Health   Financial Resource Strain: Low Risk  (09/25/2024)   Overall Financial Resource Strain (CARDIA)    Difficulty of Paying Living Expenses: Not very hard  Food Insecurity: No Food Insecurity (09/25/2024)   Hunger Vital Sign    Worried  About Running Out of Food in the Last Year: Never true    Ran Out of Food in the Last Year: Never true  Transportation Needs: No Transportation Needs (09/25/2024)   PRAPARE - Administrator, Civil Service (Medical): No    Lack of Transportation (Non-Medical): No  Physical Activity: Sufficiently Active (09/25/2024)   Exercise Vital Sign    Days of Exercise per Week: 5 days    Minutes of Exercise per Session: 30 min  Stress: Stress Concern Present (09/25/2024)   [employer] of Occupational Health - Occupational Stress Questionnaire    Feeling of Stress: Very much  Social Connections: Moderately Isolated (09/25/2024)   Social Connection and Isolation  Panel    Frequency of Communication with Friends and Family: More than three times a week    Frequency of Social Gatherings with Friends and Family: More than three times a week    Attends Religious Services: 1 to 4 times per year    Active Member of [employer] or Organizations: No    Attends Engineer, Structural: Not on file    Marital Status: Divorced  Catering Manager Violence: Not on file    Outpatient Medications Prior to Visit  Medication Sig Dispense Refill   albuterol  (VENTOLIN  HFA) 108 (90 Base) MCG/ACT inhaler Inhale 2 puffs into the lungs every 4 (four) hours as needed for wheezing or shortness of breath. (Patient not taking: Reported on 09/25/2024) 18 g 0   cyclobenzaprine  (FLEXERIL ) 5 MG tablet TAKE 1 TABLET(5 MG) BY MOUTH TWICE DAILY AS NEEDED (Patient not taking: Reported on 09/25/2024) 30 tablet 1   loperamide  (IMODIUM ) 2 MG capsule Take 1 capsule (2 mg total) by mouth 4 (four) times daily as needed for diarrhea or loose stools. Do not take more than 4 tablets within a 24-hour time period. (Patient not taking: Reported on 09/25/2024) 12 capsule 0   naproxen  (NAPROSYN ) 500 MG tablet TAKE 1 TABLET BY MOUTH TWICE DAILY WITH MEALS (Patient not taking: Reported on 09/25/2024) 30 tablet 0   sildenafil  (VIAGRA ) 50 MG tablet Take 1 tablet (50 mg total) by mouth as needed for erectile dysfunction. (Patient not taking: Reported on 09/25/2024) 30 tablet 2   chlorthalidone  (HYGROTON ) 25 MG tablet Take 0.5 tablets (12.5 mg total) by mouth daily. (Patient not taking: Reported on 09/25/2024) 45 tablet 1   hydrochlorothiazide  (HYDRODIURIL ) 25 MG tablet Take 1 tablet (25 mg total) by mouth daily. (Patient not taking: Reported on 01/30/2018) 90 tablet 3   losartan  (COZAAR ) 100 MG tablet TAKE 1 TABLET(100 MG) BY MOUTH DAILY (Patient not taking: Reported on 09/25/2024) 30 tablet 0   metoprolol  succinate (TOPROL -XL) 25 MG 24 hr tablet TAKE 1 TABLET(25 MG) BY MOUTH DAILY (Patient not taking:  Reported on 09/25/2024) 30 tablet 3   NIFEdipine  (PROCARDIA  XL/NIFEDICAL XL) 60 MG 24 hr tablet TAKE 1 TABLET(60 MG) BY MOUTH DAILY (Patient not taking: Reported on 09/25/2024) 30 tablet 1   omeprazole  (PRILOSEC) 20 MG capsule TAKE ONE CAPSULE BY MOUTH ONCE DAILY (Patient not taking: Reported on 01/30/2018) 30 capsule 3   promethazine -dextromethorphan (PROMETHAZINE -DM) 6.25-15 MG/5ML syrup Take 5 mLs by mouth 4 (four) times daily as needed. (Patient not taking: Reported on 09/25/2024) 118 mL 0   traZODone  (DESYREL ) 50 MG tablet TAKE 1/2 TO 1 TABLET(25 TO 50 MG) BY MOUTH AT BEDTIME AS NEEDED FOR SLEEP (Patient not taking: Reported on 09/25/2024) 30 tablet 3   No facility-administered medications prior to visit.    No Known  Allergies  ROS Review of Systems  Constitutional:  Positive for fatigue. Negative for chills and fever.  HENT:  Negative for congestion and sore throat.   Eyes:  Negative for pain and discharge.  Respiratory:  Negative for cough and shortness of breath.   Cardiovascular:  Positive for leg swelling. Negative for chest pain and palpitations.  Gastrointestinal:  Negative for diarrhea, nausea and vomiting.  Endocrine: Negative for polydipsia and polyuria.  Genitourinary:  Positive for penile discharge. Negative for dysuria and hematuria.  Musculoskeletal:  Positive for back pain. Negative for neck pain and neck stiffness.  Skin:  Negative for rash.  Neurological:  Negative for weakness, numbness and headaches.  Psychiatric/Behavioral:  Positive for sleep disturbance. Negative for agitation and behavioral problems.       Objective:    Physical Exam Vitals reviewed.  Constitutional:      General: He is not in acute distress.    Appearance: He is obese. He is not diaphoretic.  HENT:     Head: Normocephalic and atraumatic.     Nose: Nose normal.     Mouth/Throat:     Mouth: Mucous membranes are moist.  Eyes:     General: No scleral icterus.    Extraocular Movements:  Extraocular movements intact.  Cardiovascular:     Rate and Rhythm: Normal rate and regular rhythm.     Heart sounds: Normal heart sounds. No murmur heard. Pulmonary:     Breath sounds: Normal breath sounds. No wheezing or rales.  Abdominal:     Palpations: Abdomen is soft.     Tenderness: There is no abdominal tenderness.  Musculoskeletal:     Cervical back: Neck supple.     Right lower leg: Edema (Trace) present.     Left lower leg: Edema (Trace) present.  Skin:    General: Skin is warm.     Findings: No rash.  Neurological:     General: No focal deficit present.     Mental Status: He is alert and oriented to person, place, and time.     Sensory: No sensory deficit.     Motor: No weakness.  Psychiatric:        Mood and Affect: Mood normal.        Behavior: Behavior normal.     BP (!) 142/90 (BP Location: Left Arm)   Pulse (!) 105   Ht 5' 8 (1.727 m)   Wt (!) 307 lb 6.4 oz (139.4 kg)   SpO2 92%   BMI 46.74 kg/m  Wt Readings from Last 3 Encounters:  09/25/24 (!) 307 lb 6.4 oz (139.4 kg)  11/23/23 (!) 309 lb 3.2 oz (140.3 kg)  07/13/23 (!) 302 lb 9.6 oz (137.3 kg)    Lab Results  Component Value Date   TSH 1.190 11/16/2022   Lab Results  Component Value Date   WBC 6.4 07/02/2023   HGB 15.3 07/02/2023   HCT 48.4 07/02/2023   MCV 86 07/02/2023   PLT 306 07/02/2023   Lab Results  Component Value Date   NA 144 11/23/2023   K 4.2 11/23/2023   CO2 29 11/23/2023   GLUCOSE 98 11/23/2023   BUN 18 11/23/2023   CREATININE 1.30 (H) 11/23/2023   BILITOT 0.5 07/02/2023   ALKPHOS 70 07/02/2023   AST 20 07/02/2023   ALT 32 07/02/2023   PROT 6.9 07/02/2023   ALBUMIN 4.1 07/02/2023   CALCIUM 9.4 11/23/2023   ANIONGAP 9 05/19/2020   EGFR 69 11/23/2023   Lab  Results  Component Value Date   CHOL 172 11/16/2022   Lab Results  Component Value Date   HDL 32 (L) 11/16/2022   Lab Results  Component Value Date   LDLCALC 116 (H) 11/16/2022   Lab Results   Component Value Date   TRIG 135 11/16/2022   Lab Results  Component Value Date   CHOLHDL 5.4 (H) 11/16/2022   Lab Results  Component Value Date   HGBA1C 5.9 (H) 07/13/2023      Assessment & Plan:   Problem List Items Addressed This Visit       Cardiovascular and Mediastinum   Essential hypertension - Primary   BP Readings from Last 1 Encounters:  09/25/24 (!) 142/90   Elevated today as he has run out of his medicines Was well-controlled with Losartan  100 mg QD, Metoprolol  25 mg QD, Nifedipine  60 mg QD and Chlorthalidone  12.5 mg QD, refilled Likely a component of OSA as well Counseled for compliance with the medications Advised DASH diet and moderate exercise/walking, at least 150 mins/week      Relevant Medications   chlorthalidone  (HYGROTON ) 25 MG tablet   losartan  (COZAAR ) 100 MG tablet   metoprolol  succinate (TOPROL -XL) 25 MG 24 hr tablet   NIFEdipine  (PROCARDIA  XL/NIFEDICAL XL) 60 MG 24 hr tablet   Other Relevant Orders   TSH   CMP14+EGFR   CBC with Differential/Platelet     Respiratory   OSA (obstructive sleep apnea)   Snoring, chronic fatigue, apneic episodes and uncontrolled HTN in association with morbid obesity STOP-BANG: 7 Home sleep study showed severe OSA Has CPAP now, advised to use it regularly  Advised to check for Zepbound or Wegovy, would benefit from weight loss - but he prefers to wait for now        Genitourinary   Urethritis   Check UA with reflex culture, Neisseria/chlamydia testing Empiric azithromycin  1 g single dose prescribed      Relevant Medications   azithromycin  (ZITHROMAX ) 500 MG tablet   Other Relevant Orders   Chlamydia/GC NAA, Confirmation   UA/M w/rflx Culture, Routine     Other   Vitamin D  deficiency   Relevant Orders   VITAMIN D  25 Hydroxy (Vit-D Deficiency, Fractures)   Insomnia due to medical condition   His insomnia is mostly due to OSA, but underlying anxiety also leading to insomnia Sleep hygiene  discussed, material provided Had improved with CPAP use, needs to start using it regularly Trazodone  as needed, refilled      Relevant Medications   traZODone  (DESYREL ) 50 MG tablet   Prediabetes   Lab Results  Component Value Date   HGBA1C 5.9 (H) 07/13/2023   Advised to follow low-carb diet      Relevant Orders   Hemoglobin A1c   CMP14+EGFR   Other Visit Diagnoses       Mixed hyperlipidemia       Relevant Medications   chlorthalidone  (HYGROTON ) 25 MG tablet   losartan  (COZAAR ) 100 MG tablet   metoprolol  succinate (TOPROL -XL) 25 MG 24 hr tablet   NIFEdipine  (PROCARDIA  XL/NIFEDICAL XL) 60 MG 24 hr tablet   Other Relevant Orders   Lipid panel        Meds ordered this encounter  Medications   chlorthalidone  (HYGROTON ) 25 MG tablet    Sig: Take 0.5 tablets (12.5 mg total) by mouth daily.    Dispense:  45 tablet    Refill:  1   losartan  (COZAAR ) 100 MG tablet    Sig: Take 1  tablet (100 mg total) by mouth daily.    Dispense:  90 tablet    Refill:  1   metoprolol  succinate (TOPROL -XL) 25 MG 24 hr tablet    Sig: Take 1 tablet (25 mg total) by mouth daily.    Dispense:  90 tablet    Refill:  1   NIFEdipine  (PROCARDIA  XL/NIFEDICAL XL) 60 MG 24 hr tablet    Sig: Take 1 tablet (60 mg total) by mouth daily.    Dispense:  90 tablet    Refill:  1   traZODone  (DESYREL ) 50 MG tablet    Sig: Take 1 tablet (50 mg total) by mouth at bedtime as needed for sleep.    Dispense:  90 tablet    Refill:  1   azithromycin  (ZITHROMAX ) 500 MG tablet    Sig: Take 2 tablets (1,000 mg total) by mouth daily.    Dispense:  2 tablet    Refill:  0    Follow-up: Return in about 2 months (around 11/25/2024) for HTN.    Suzzane MARLA Blanch, MD

## 2024-09-25 NOTE — Assessment & Plan Note (Signed)
Lab Results  Component Value Date   HGBA1C 5.9 (H) 07/13/2023   Advised to follow low-carb diet

## 2024-09-25 NOTE — Assessment & Plan Note (Signed)
 His insomnia is mostly due to OSA, but underlying anxiety also leading to insomnia Sleep hygiene discussed, material provided Had improved with CPAP use, needs to start using it regularly Trazodone  as needed, refilled

## 2024-09-25 NOTE — Assessment & Plan Note (Signed)
 Check UA with reflex culture, Neisseria/chlamydia testing Empiric azithromycin  1 g single dose prescribed

## 2024-09-28 ENCOUNTER — Ambulatory Visit: Payer: Self-pay | Admitting: Internal Medicine

## 2024-09-28 LAB — MICROSCOPIC EXAMINATION
Bacteria, UA: NONE SEEN
Casts: NONE SEEN /LPF
Epithelial Cells (non renal): NONE SEEN /HPF (ref 0–10)
WBC, UA: >30 /HPF — AB (ref 0–5)

## 2024-09-28 LAB — UA/M W/RFLX CULTURE, ROUTINE
Bilirubin, UA: NEGATIVE
Glucose, UA: NEGATIVE
Ketones, UA: NEGATIVE
Nitrite, UA: NEGATIVE
Specific Gravity, UA: 1.014 (ref 1.005–1.030)
Urobilinogen, Ur: 0.2 mg/dL (ref 0.2–1.0)
pH, UA: 7 (ref 5.0–7.5)

## 2024-09-28 LAB — URINE CULTURE, REFLEX: Organism ID, Bacteria: NO GROWTH

## 2024-09-29 ENCOUNTER — Ambulatory Visit

## 2024-09-29 DIAGNOSIS — A749 Chlamydial infection, unspecified: Secondary | ICD-10-CM

## 2024-09-29 MED ORDER — CEFUROXIME AXETIL 500 MG PO TABS: 500.0000 mg | ORAL_TABLET | Freq: Two times a day (BID) | ORAL | 0 refills | Status: DC

## 2024-09-29 MED ORDER — CEFTRIAXONE SODIUM 500 MG IJ SOLR
500.0000 mg | Freq: Once | INTRAMUSCULAR | Status: AC
  Administered 2024-09-29: 500 mg via INTRAMUSCULAR

## 2024-09-29 NOTE — Progress Notes (Unsigned)
 Patient is in office today for a nurse visit for rocephin  injection. Patient Injection was given in the  Right upper quad. gluteus. Patient tolerated injection well.

## 2024-09-30 LAB — CHLAMYDIA/GC NAA, CONFIRMATION
Chlamydia trachomatis, NAA: NEGATIVE
Neisseria gonorrhoeae, NAA: POSITIVE — AB

## 2024-09-30 LAB — N. GONORRHOEAE NAA, CONFIRM: N. gonorrhoeae NAA, Confirm: POSITIVE — AB

## 2024-11-07 ENCOUNTER — Encounter: Payer: Self-pay | Admitting: Internal Medicine

## 2024-11-07 ENCOUNTER — Other Ambulatory Visit: Payer: Self-pay | Admitting: Internal Medicine

## 2024-11-07 DIAGNOSIS — G8929 Other chronic pain: Secondary | ICD-10-CM

## 2024-11-07 MED ORDER — CYCLOBENZAPRINE HCL 5 MG PO TABS: ORAL_TABLET | ORAL | 1 refills | Status: AC

## 2024-11-07 MED ORDER — NAPROXEN 500 MG PO TABS: 500.0000 mg | ORAL_TABLET | Freq: Two times a day (BID) | ORAL | 2 refills | Status: AC

## 2024-11-28 ENCOUNTER — Ambulatory Visit: Admitting: Internal Medicine

## 2025-02-06 ENCOUNTER — Encounter: Admitting: Internal Medicine
